# Patient Record
Sex: Male | Born: 1975
Health system: Southern US, Community
[De-identification: ages and names within clinical notes are randomized; demographics above are authoritative.]

## PROBLEM LIST (undated history)

## (undated) DIAGNOSIS — Z789 Other specified health status: Secondary | ICD-10-CM

## (undated) DIAGNOSIS — K5190 Ulcerative colitis, unspecified, without complications: Secondary | ICD-10-CM

## (undated) HISTORY — PX: NO PAST SURGERIES: SHX2092

## (undated) HISTORY — PX: COLONOSCOPY: SHX174

---

## 2000-11-16 ENCOUNTER — Encounter: Payer: Self-pay | Admitting: Emergency Medicine

## 2000-11-16 ENCOUNTER — Emergency Department (HOSPITAL_COMMUNITY): Admission: EM | Admit: 2000-11-16 | Discharge: 2000-11-16 | Payer: Self-pay | Admitting: Emergency Medicine

## 2002-01-19 ENCOUNTER — Ambulatory Visit (HOSPITAL_COMMUNITY): Admission: RE | Admit: 2002-01-19 | Discharge: 2002-01-19 | Payer: Self-pay | Admitting: Internal Medicine

## 2006-07-21 ENCOUNTER — Ambulatory Visit: Payer: Self-pay | Admitting: Internal Medicine

## 2006-10-13 ENCOUNTER — Ambulatory Visit: Payer: Self-pay | Admitting: Internal Medicine

## 2012-08-15 ENCOUNTER — Encounter (HOSPITAL_COMMUNITY): Payer: Self-pay | Admitting: *Deleted

## 2012-08-15 ENCOUNTER — Emergency Department (HOSPITAL_COMMUNITY): Payer: No Typology Code available for payment source

## 2012-08-15 ENCOUNTER — Emergency Department (HOSPITAL_COMMUNITY)
Admission: EM | Admit: 2012-08-15 | Discharge: 2012-08-15 | Disposition: A | Discharge status: Home / Self Care | Payer: No Typology Code available for payment source | Attending: Emergency Medicine | Admitting: Emergency Medicine

## 2012-08-15 DIAGNOSIS — Y92410 Unspecified street and highway as the place of occurrence of the external cause: Secondary | ICD-10-CM | POA: Insufficient documentation

## 2012-08-15 DIAGNOSIS — S0993XA Unspecified injury of face, initial encounter: Secondary | ICD-10-CM | POA: Insufficient documentation

## 2012-08-15 DIAGNOSIS — S199XXA Unspecified injury of neck, initial encounter: Secondary | ICD-10-CM | POA: Insufficient documentation

## 2012-08-15 DIAGNOSIS — Y9389 Activity, other specified: Secondary | ICD-10-CM | POA: Insufficient documentation

## 2012-08-15 DIAGNOSIS — M62838 Other muscle spasm: Secondary | ICD-10-CM | POA: Insufficient documentation

## 2012-08-15 DIAGNOSIS — M542 Cervicalgia: Secondary | ICD-10-CM

## 2012-08-15 MED ORDER — HYDROCODONE-ACETAMINOPHEN 5-325 MG PO TABS
2.0000 | ORAL_TABLET | Freq: Once | ORAL | Status: AC
  Administered 2012-08-15: 2 via ORAL
  Filled 2012-08-15: qty 2

## 2012-08-15 MED ORDER — CYCLOBENZAPRINE HCL 10 MG PO TABS: 10.0000 mg | ORAL_TABLET | Freq: Once | ORAL | Status: DC

## 2012-08-15 MED ORDER — CYCLOBENZAPRINE HCL 10 MG PO TABS
10.0000 mg | ORAL_TABLET | Freq: Once | ORAL | Status: AC
  Administered 2012-08-15: 10 mg via ORAL
  Filled 2012-08-15: qty 1

## 2012-08-15 MED ORDER — HYDROCODONE-ACETAMINOPHEN 5-325 MG PO TABS: 2.0000 | ORAL_TABLET | Freq: Once | ORAL | Status: DC

## 2012-08-15 NOTE — ED Notes (Signed)
Pt with c/o pain to left side of neck that radiates to left shoulder
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
## 2012-08-15 NOTE — ED Provider Notes (Signed)
residual .ressiden I reviewed the resident's note and I agree with the findings and plan.   Benny Lennert, MD 08/15/12 [phone]
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
## 2014-11-20 ENCOUNTER — Encounter (HOSPITAL_COMMUNITY): Payer: Self-pay | Admitting: Emergency Medicine

## 2014-11-20 ENCOUNTER — Emergency Department (HOSPITAL_COMMUNITY): Payer: BLUE CROSS/BLUE SHIELD

## 2014-11-20 ENCOUNTER — Emergency Department (HOSPITAL_COMMUNITY)
Admission: EM | Admit: 2014-11-20 | Discharge: 2014-11-20 | Disposition: A | Discharge status: Home / Self Care | Payer: BLUE CROSS/BLUE SHIELD | Attending: Emergency Medicine | Admitting: Emergency Medicine

## 2014-11-20 DIAGNOSIS — Z720 Tobacco use: Secondary | ICD-10-CM | POA: Diagnosis not present

## 2014-11-20 DIAGNOSIS — Y990 Civilian activity done for income or pay: Secondary | ICD-10-CM | POA: Diagnosis not present

## 2014-11-20 DIAGNOSIS — Y9389 Activity, other specified: Secondary | ICD-10-CM | POA: Diagnosis not present

## 2014-11-20 DIAGNOSIS — S39012A Strain of muscle, fascia and tendon of lower back, initial encounter: Secondary | ICD-10-CM | POA: Diagnosis not present

## 2014-11-20 DIAGNOSIS — Y92410 Unspecified street and highway as the place of occurrence of the external cause: Secondary | ICD-10-CM | POA: Diagnosis not present

## 2014-11-20 DIAGNOSIS — S3992XA Unspecified injury of lower back, initial encounter: Secondary | ICD-10-CM | POA: Diagnosis present

## 2014-11-20 MED ORDER — CYCLOBENZAPRINE HCL 10 MG PO TABS: 10.0000 mg | ORAL_TABLET | Freq: Three times a day (TID) | ORAL | Status: DC | PRN

## 2014-11-20 MED ORDER — IBUPROFEN 800 MG PO TABS: 800.0000 mg | ORAL_TABLET | Freq: Three times a day (TID) | ORAL | Status: DC

## 2014-11-20 MED ORDER — HYDROCODONE-ACETAMINOPHEN 5-325 MG PO TABS: ORAL_TABLET | ORAL | Status: DC

## 2014-11-20 NOTE — Discharge Instructions (Signed)

## 2014-11-20 NOTE — ED Notes (Signed)
Pt verbalized understanding of no driving and to use caution within 4 hours of taking pain meds due to meds cause drowsiness 
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
## 2014-11-20 NOTE — ED Notes (Signed)
T. Triplett, PA at bedside. 
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
## 2015-01-21 ENCOUNTER — Encounter (HOSPITAL_COMMUNITY): Payer: Self-pay

## 2015-01-21 ENCOUNTER — Emergency Department (HOSPITAL_COMMUNITY)
Admission: EM | Admit: 2015-01-21 | Discharge: 2015-01-21 | Disposition: A | Discharge status: Home / Self Care | Payer: BLUE CROSS/BLUE SHIELD | Attending: Emergency Medicine | Admitting: Emergency Medicine

## 2015-01-21 DIAGNOSIS — Z720 Tobacco use: Secondary | ICD-10-CM | POA: Insufficient documentation

## 2015-01-21 DIAGNOSIS — Z791 Long term (current) use of non-steroidal anti-inflammatories (NSAID): Secondary | ICD-10-CM | POA: Insufficient documentation

## 2015-01-21 DIAGNOSIS — Y998 Other external cause status: Secondary | ICD-10-CM | POA: Insufficient documentation

## 2015-01-21 DIAGNOSIS — S39012A Strain of muscle, fascia and tendon of lower back, initial encounter: Secondary | ICD-10-CM | POA: Diagnosis not present

## 2015-01-21 DIAGNOSIS — Y9289 Other specified places as the place of occurrence of the external cause: Secondary | ICD-10-CM | POA: Diagnosis not present

## 2015-01-21 DIAGNOSIS — X58XXXA Exposure to other specified factors, initial encounter: Secondary | ICD-10-CM | POA: Insufficient documentation

## 2015-01-21 DIAGNOSIS — Y9389 Activity, other specified: Secondary | ICD-10-CM | POA: Diagnosis not present

## 2015-01-21 DIAGNOSIS — S3992XA Unspecified injury of lower back, initial encounter: Secondary | ICD-10-CM | POA: Diagnosis present
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
## 2015-01-21 NOTE — ED Notes (Signed)
Lower back pain radiating upwards with movement/exertion since yesterday after moving furniture

## 2015-01-21 NOTE — Discharge Instructions (Signed)
Use ice on the sore area for 2-3 days (4 times a day), after that, use heat. Thak Ibuprofen or your Tramadol for pain.    Back Pain, Adult Low back pain is very common. About 1 in 5 people have back pain.The cause of low back pain is rarely dangerous. The pain often gets better over time.About half of people with a sudden onset of back pain feel better in just 2 weeks. About 8 in 10 people feel better by 6 weeks.  CAUSES Some common causes of back pain include:  Strain of the muscles or ligaments supporting the spine.  Wear and tear (degeneration) of the spinal discs.  Arthritis.  Direct injury to the back. DIAGNOSIS Most of the time, the direct cause of low back pain is not known.However, back pain can be treated effectively even when the exact cause of the pain is unknown.Answering your caregiver's questions about your overall health and symptoms is one of the most accurate ways to make sure the cause of your pain is not dangerous. If your caregiver needs more information, he or she may order lab work or imaging tests (X-rays or MRIs).However, even if imaging tests show changes in your back, this usually does not require surgery. HOME CARE INSTRUCTIONS For many people, back pain returns.Since low back pain is rarely dangerous, it is often a condition that people can learn to [facility] their own.   Remain active. It is stressful on the back to sit or stand in one place. Do not sit, drive, or stand in one place for more than 30 minutes at a time. Take short walks on level surfaces as soon as pain allows.Try to increase the length of time you walk each day.  Do not stay in bed.Resting more than 1 or 2 days can delay your recovery.  Do not avoid exercise or work.Your body is made to move.It is not dangerous to be active, even though your back may hurt.Your back will likely heal faster if you return to being active before your pain is gone.  Pay attention to your body when you  bend and lift. Many people have less discomfortwhen lifting if they bend their knees, keep the load close to their bodies,and avoid twisting. Often, the most comfortable positions are those that put less stress on your recovering back.  Find a comfortable position to sleep. Use a firm mattress and lie on your side with your knees slightly bent. If you lie on your back, put a pillow under your knees.  Only take over-the-counter or prescription medicines as directed by your caregiver. Over-the-counter medicines to reduce pain and inflammation are often the most helpful.Your caregiver may prescribe muscle relaxant drugs.These medicines help dull your pain so you can more quickly return to your normal activities and healthy exercise.  Put ice on the injured area.  Put ice in a plastic bag.  Place a towel between your skin and the bag.  Leave the ice on for 15-20 minutes, 03-04 times a day for the first 2 to 3 days. After that, ice and heat may be alternated to reduce pain and spasms.  Ask your caregiver about trying back exercises and gentle massage. This may be of some benefit.  Avoid feeling anxious or stressed.Stress increases muscle tension and can worsen back pain.It is important to recognize when you are anxious or stressed and learn ways to manage it.Exercise is a great option. SEEK MEDICAL CARE IF:  You have pain that is not relieved with  rest or medicine.  You have pain that does not improve in 1 week.  You have new symptoms.  You are generally not feeling well. SEEK IMMEDIATE MEDICAL CARE IF:   You have pain that radiates from your back into your legs.  You develop new bowel or bladder control problems.  You have unusual weakness or numbness in your arms or legs.  You develop nausea or vomiting.  You develop abdominal pain.  You feel faint. Document Released: 06/08/2005 Document Revised: 12/08/2011 Document Reviewed: 10/10/2013 [facility] Patient Information 2015  [place], [place]. This information is not intended to replace advice given to you by your health care provider. Make sure you discuss any questions you have with your health care provider.  Back Exercises Back exercises help treat and prevent back injuries. The goal of back exercises is to increase the strength of your abdominal and back muscles and the flexibility of your back. These exercises should be started when you no longer have back pain. Back exercises include:  Pelvic Tilt. Lie on your back with your knees bent. Tilt your pelvis until the lower part of your back is against the floor. Hold this position 5 to 10 sec and repeat 5 to 10 times.  Knee to Chest. Pull first 1 knee up against your chest and hold for 20 to 30 seconds, repeat this with the other knee, and then both knees. This may be done with the other leg straight or bent, whichever feels better.  Sit-Ups or Curl-Ups. Bend your knees 90 degrees. Start with tilting your pelvis, and do a partial, slow sit-up, lifting your trunk only 30 to 45 degrees off the floor. Take at least 2 to 3 seconds for each sit-up. Do not do sit-ups with your knees out straight. If partial sit-ups are difficult, simply do the above but with only tightening your abdominal muscles and holding it as directed.  Hip-Lift. Lie on your back with your knees flexed 90 degrees. Push down with your feet and shoulders as you raise your hips a couple inches off the floor; hold for 10 seconds, repeat 5 to 10 times.  Back arches. Lie on your stomach, propping yourself up on bent elbows. Slowly press on your hands, causing an arch in your low back. Repeat 3 to 5 times. Any initial stiffness and discomfort should lessen with repetition over time.  Shoulder-Lifts. Lie face down with arms beside your body. Keep hips and torso pressed to floor as you slowly lift your head and shoulders off the floor. Do not overdo your exercises, especially in the beginning. Exercises may cause  you some mild back discomfort which lasts for a few minutes; however, if the pain is more severe, or lasts for more than 15 minutes, do not continue exercises until you see your caregiver. Improvement with exercise therapy for back problems is slow.  See your caregivers for assistance with developing a proper back exercise program. Document Released: 07/16/2004 Document Revised: 08/31/2011 Document Reviewed: 04/09/2011 [facility] Patient Information 2015 [place], [place]. This information is not intended to replace advice given to you by your health care provider. Make sure you discuss any questions you have with your health care provider.

## 2016-02-03 DIAGNOSIS — K5190 Ulcerative colitis, unspecified, without complications: Secondary | ICD-10-CM | POA: Diagnosis not present

## 2016-07-31 DIAGNOSIS — Z0000 Encounter for general adult medical examination without abnormal findings: Secondary | ICD-10-CM | POA: Diagnosis not present

## 2016-08-04 DIAGNOSIS — Z6833 Body mass index (BMI) 33.0-33.9, adult: Secondary | ICD-10-CM | POA: Diagnosis not present

## 2016-08-04 DIAGNOSIS — E782 Mixed hyperlipidemia: Secondary | ICD-10-CM | POA: Diagnosis not present

## 2016-08-04 DIAGNOSIS — D509 Iron deficiency anemia, unspecified: Secondary | ICD-10-CM | POA: Diagnosis not present

## 2016-08-04 DIAGNOSIS — K5190 Ulcerative colitis, unspecified, without complications: Secondary | ICD-10-CM | POA: Diagnosis not present

## 2017-02-19 IMAGING — DX DG LUMBAR SPINE COMPLETE 4+V
5 series · 5 of 5 positions shown · non-contrast
Comparison: None.

CLINICAL DATA: Lumbago with left-sided radicular symptoms after
jerking motion at work 1 day prior

EXAM:
LUMBAR SPINE - COMPLETE 4+ VIEW

[l-spine ap]
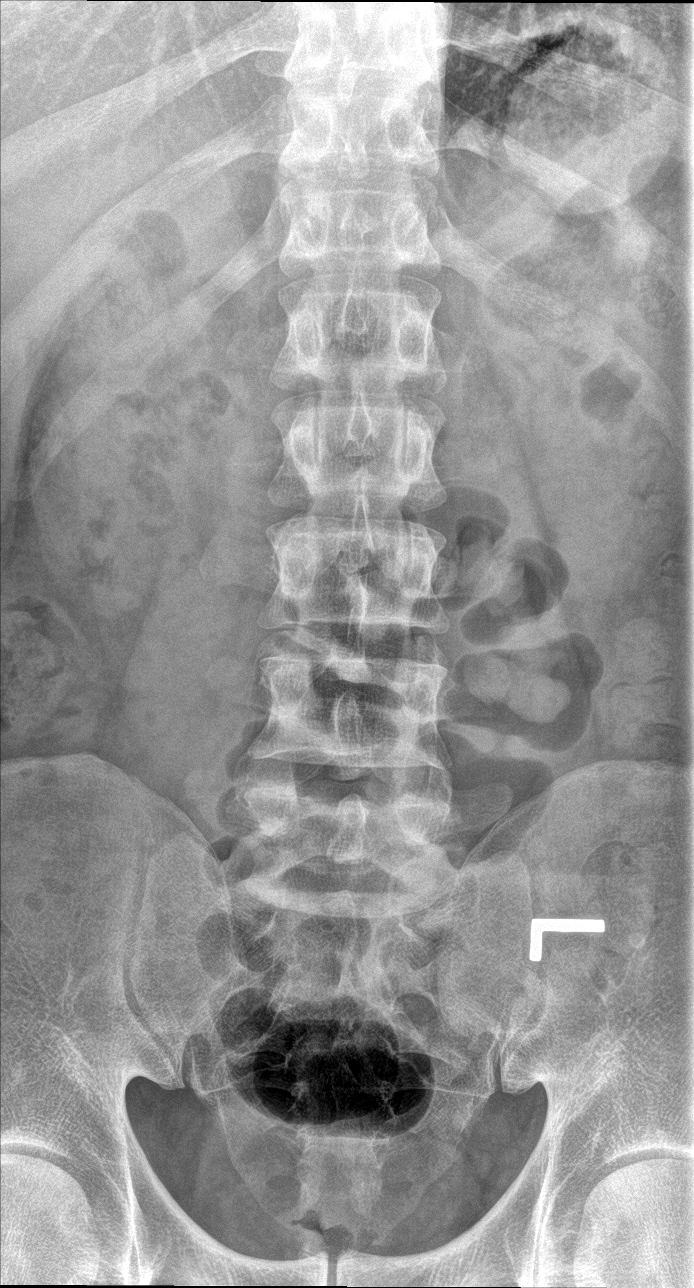

[l-spine obl (1 of 2)]
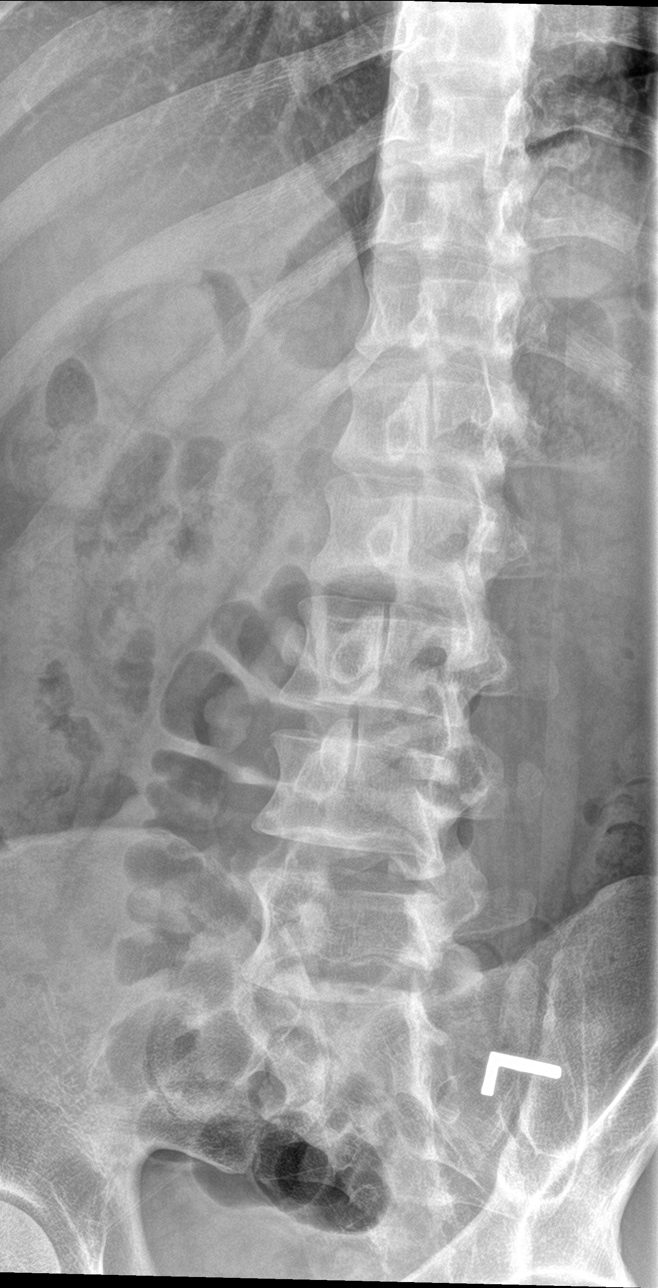

[l-spine obl (2 of 2)]
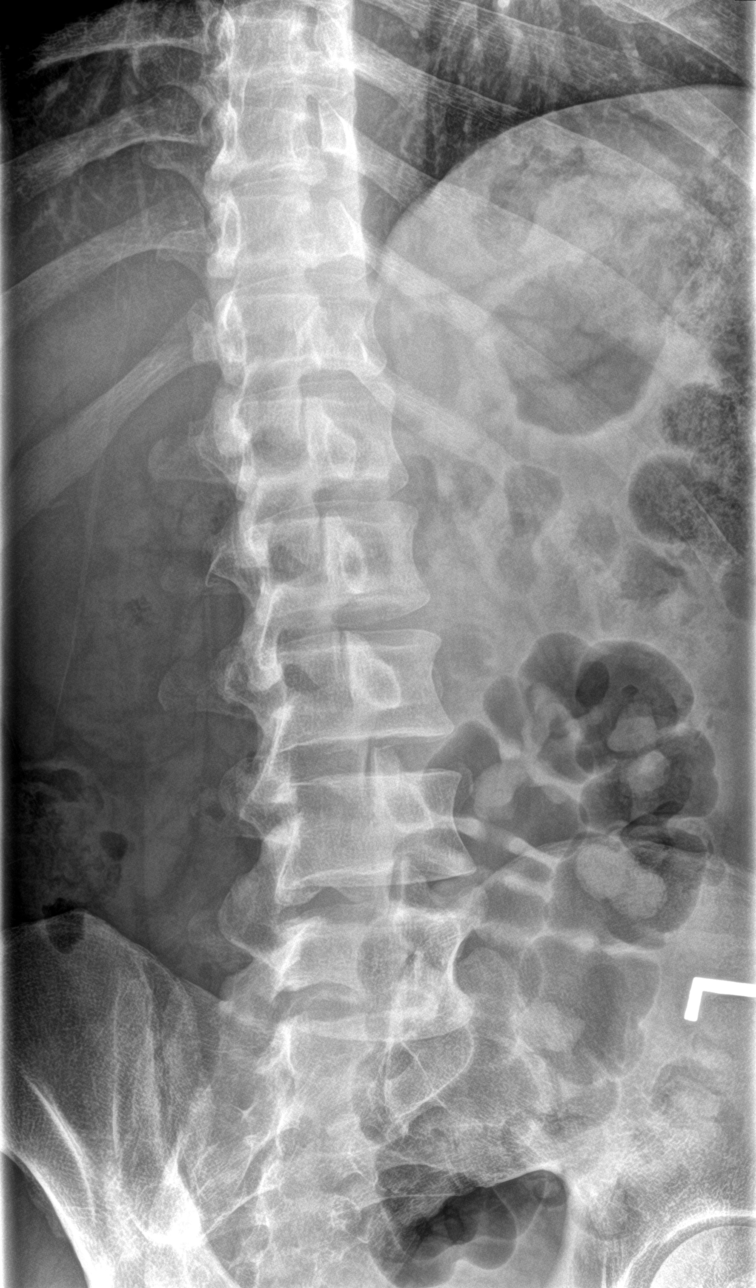

[l-spine lat]
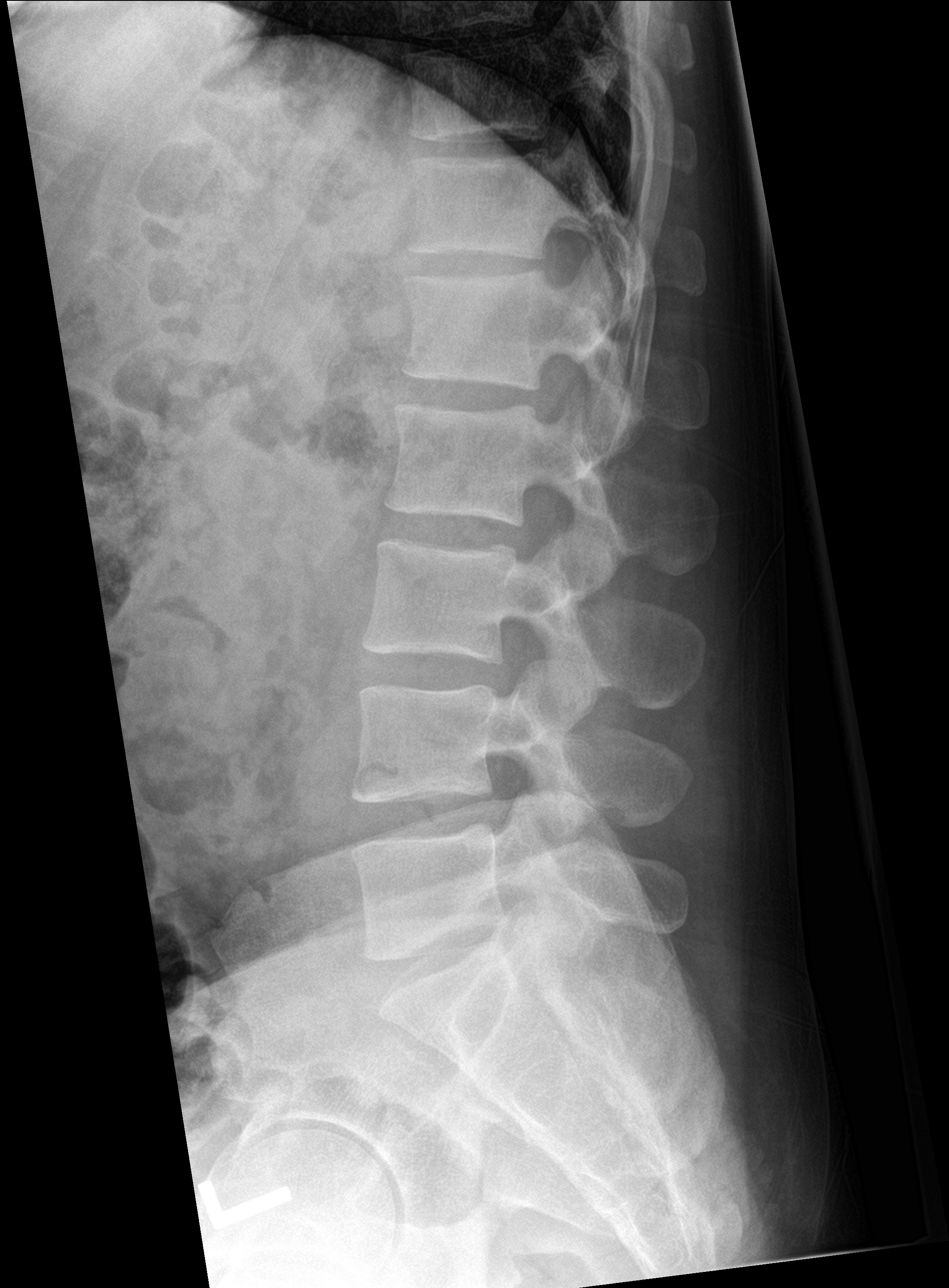

[l-spine spot]
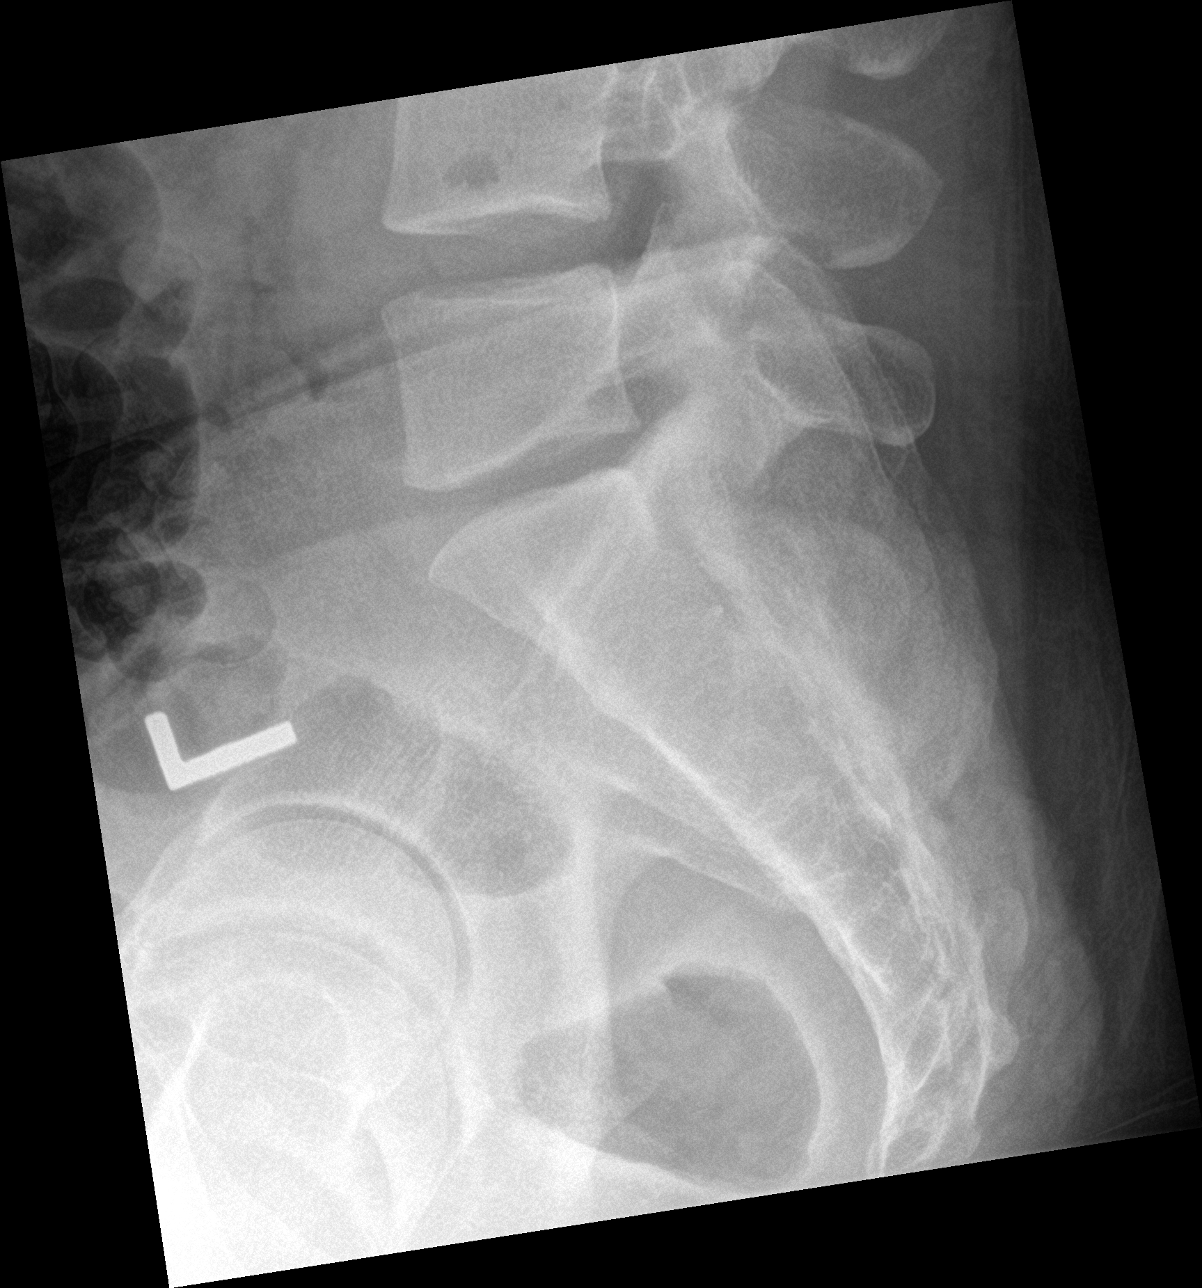

[5 of 5 positions shown; findings below may reference images not displayed]

FINDINGS: Frontal, lateral, spot lumbosacral lateral, and bilateral oblique
views were obtained. There are 5 non-rib-bearing lumbar type
vertebral bodies. There is no fracture or spondylolisthesis. Disc
spaces appear intact. There is no appreciable facet arthropathy.
IMPRESSION: No fracture or spondylolisthesis.  No appreciable arthropathy.

## 2017-07-23 DIAGNOSIS — K529 Noninfective gastroenteritis and colitis, unspecified: Secondary | ICD-10-CM | POA: Diagnosis not present

## 2017-07-23 DIAGNOSIS — M25521 Pain in right elbow: Secondary | ICD-10-CM | POA: Diagnosis not present

## 2017-08-02 DIAGNOSIS — D509 Iron deficiency anemia, unspecified: Secondary | ICD-10-CM | POA: Diagnosis not present

## 2017-08-02 DIAGNOSIS — E782 Mixed hyperlipidemia: Secondary | ICD-10-CM | POA: Diagnosis not present

## 2017-11-29 DIAGNOSIS — M79601 Pain in right arm: Secondary | ICD-10-CM | POA: Diagnosis not present

## 2017-11-29 DIAGNOSIS — M25521 Pain in right elbow: Secondary | ICD-10-CM | POA: Diagnosis not present

## 2018-02-03 DIAGNOSIS — M79601 Pain in right arm: Secondary | ICD-10-CM | POA: Diagnosis not present

## 2018-02-03 DIAGNOSIS — M25521 Pain in right elbow: Secondary | ICD-10-CM | POA: Diagnosis not present

## 2018-02-03 DIAGNOSIS — K529 Noninfective gastroenteritis and colitis, unspecified: Secondary | ICD-10-CM | POA: Diagnosis not present

## 2018-02-07 DIAGNOSIS — K529 Noninfective gastroenteritis and colitis, unspecified: Secondary | ICD-10-CM | POA: Diagnosis not present

## 2018-02-07 DIAGNOSIS — Z0000 Encounter for general adult medical examination without abnormal findings: Secondary | ICD-10-CM | POA: Diagnosis not present

## 2018-02-07 DIAGNOSIS — M25531 Pain in right wrist: Secondary | ICD-10-CM | POA: Diagnosis not present

## 2018-02-07 DIAGNOSIS — M79601 Pain in right arm: Secondary | ICD-10-CM | POA: Diagnosis not present

## 2018-04-01 DIAGNOSIS — M79601 Pain in right arm: Secondary | ICD-10-CM | POA: Diagnosis not present

## 2018-04-01 DIAGNOSIS — M25531 Pain in right wrist: Secondary | ICD-10-CM | POA: Diagnosis not present

## 2018-07-05 DIAGNOSIS — M25531 Pain in right wrist: Secondary | ICD-10-CM | POA: Diagnosis not present

## 2018-07-05 DIAGNOSIS — M79601 Pain in right arm: Secondary | ICD-10-CM | POA: Diagnosis not present

## 2018-07-07 DIAGNOSIS — M25521 Pain in right elbow: Secondary | ICD-10-CM | POA: Diagnosis not present

## 2018-11-09 DIAGNOSIS — Z6828 Body mass index (BMI) 28.0-28.9, adult: Secondary | ICD-10-CM | POA: Diagnosis not present

## 2018-11-09 DIAGNOSIS — A09 Infectious gastroenteritis and colitis, unspecified: Secondary | ICD-10-CM | POA: Diagnosis not present

## 2018-11-23 DIAGNOSIS — Z0001 Encounter for general adult medical examination with abnormal findings: Secondary | ICD-10-CM | POA: Diagnosis not present

## 2018-11-23 DIAGNOSIS — K529 Noninfective gastroenteritis and colitis, unspecified: Secondary | ICD-10-CM | POA: Diagnosis not present

## 2018-11-23 DIAGNOSIS — G894 Chronic pain syndrome: Secondary | ICD-10-CM | POA: Diagnosis not present

## 2018-11-23 DIAGNOSIS — A09 Infectious gastroenteritis and colitis, unspecified: Secondary | ICD-10-CM | POA: Diagnosis not present

## 2018-11-23 DIAGNOSIS — E782 Mixed hyperlipidemia: Secondary | ICD-10-CM | POA: Diagnosis not present

## 2018-11-23 DIAGNOSIS — R7301 Impaired fasting glucose: Secondary | ICD-10-CM | POA: Diagnosis not present

## 2018-11-23 DIAGNOSIS — R944 Abnormal results of kidney function studies: Secondary | ICD-10-CM | POA: Diagnosis not present

## 2018-11-23 DIAGNOSIS — M25521 Pain in right elbow: Secondary | ICD-10-CM | POA: Diagnosis not present

## 2019-01-23 DIAGNOSIS — R809 Proteinuria, unspecified: Secondary | ICD-10-CM | POA: Diagnosis not present

## 2019-01-23 DIAGNOSIS — R1030 Lower abdominal pain, unspecified: Secondary | ICD-10-CM | POA: Diagnosis not present

## 2019-03-07 DIAGNOSIS — A09 Infectious gastroenteritis and colitis, unspecified: Secondary | ICD-10-CM | POA: Diagnosis not present

## 2019-03-07 DIAGNOSIS — R5381 Other malaise: Secondary | ICD-10-CM | POA: Diagnosis not present

## 2019-03-07 DIAGNOSIS — F1120 Opioid dependence, uncomplicated: Secondary | ICD-10-CM | POA: Diagnosis not present

## 2019-03-07 DIAGNOSIS — Z0001 Encounter for general adult medical examination with abnormal findings: Secondary | ICD-10-CM | POA: Diagnosis not present

## 2019-03-07 DIAGNOSIS — K529 Noninfective gastroenteritis and colitis, unspecified: Secondary | ICD-10-CM | POA: Diagnosis not present

## 2019-03-07 DIAGNOSIS — M25521 Pain in right elbow: Secondary | ICD-10-CM | POA: Diagnosis not present

## 2019-03-14 ENCOUNTER — Encounter: Payer: Self-pay | Admitting: Gastroenterology

## 2019-03-30 NOTE — Progress Notes (Deleted)
Referring Provider:  Benita Stabile, MD Primary Care Physician:  Benita Stabile, MD Primary Gastroenterologist:  Dr. Darrick Penna  No chief complaint on file.   HPI:   Joe Freeman is a 43 y.o. male presenting today at the request of  Margo Aye, Kathleene Hazel, MD for ulcerative colitis.   Doesn't appear patient has been seen by GI 2008.  Appears he saw Dr. Karilyn Cota back in 2003 in 2008 but I cannot review the notes.    No past medical history on file.  No past surgical history on file.  Current Outpatient Medications  Medication Sig Dispense Refill  . acetaminophen (TYLENOL) 500 MG tablet Take 1,000 mg by mouth every 6 (six) hours as needed.    . cyclobenzaprine (FLEXERIL) 10 MG tablet Take 1 tablet (10 mg total) by mouth 3 (three) times daily as needed. (Patient not taking: Reported on 01/21/2015) 21 tablet 0  . HYDROcodone-acetaminophen (NORCO/VICODIN) 5-325 MG per tablet Take one-two tabs po q 4-6 hrs prn pain (Patient not taking: Reported on 01/21/2015) 15 tablet 0  . ibuprofen (ADVIL,MOTRIN) 800 MG tablet Take 1 tablet (800 mg total) by mouth 3 (three) times daily. (Patient not taking: Reported on 01/21/2015) 21 tablet 0  . predniSONE (DELTASONE) 10 MG tablet Take 10 mg by mouth as directed.    . traMADol (ULTRAM) 50 MG tablet Take 50 mg by mouth 3 (three) times daily as needed. For pain     No current facility-administered medications for this visit.     Allergies as of 03/31/2019  . (No Known Allergies)    No family history on file.  Social History   Socioeconomic History  . Marital status: Single    Spouse name: Not on file  . Number of children: Not on file  . Years of education: Not on file  . Highest education level: Not on file  Occupational History  . Not on file  Social Needs  . Financial resource strain: Not on file  . Food insecurity    Worry: Not on file    Inability: Not on file  . Transportation needs    Medical: Not on file    Non-medical: Not on file  Tobacco Use   . Smoking status: Light Tobacco Smoker    Packs/day: 0.25    Types: Cigars  Substance and Sexual Activity  . Alcohol use: No  . Drug use: No  . Sexual activity: Not on file  Lifestyle  . Physical activity    Days per week: Not on file    Minutes per session: Not on file  . Stress: Not on file  Relationships  . Social Musician on phone: Not on file    Gets together: Not on file    Attends religious service: Not on file    Active member of club or organization: Not on file    Attends meetings of clubs or organizations: Not on file    Relationship status: Not on file  . Intimate partner violence    Fear of current or ex partner: Not on file    Emotionally abused: Not on file    Physically abused: Not on file    Forced sexual activity: Not on file  Other Topics Concern  . Not on file  Social History Narrative  . Not on file    Review of Systems: Gen: Denies any fever, chills, fatigue, weight loss, lack of appetite.  CV: Denies chest pain, heart palpitations, peripheral  edema, syncope.  Resp: Denies shortness of breath at rest or with exertion. Denies wheezing or cough.  GI: Denies dysphagia or odynophagia. Denies jaundice, hematemesis, fecal incontinence. GU : Denies urinary burning, urinary frequency, urinary hesitancy MS: Denies joint pain, muscle weakness, cramps, or limitation of movement.  Derm: Denies rash, itching, dry skin Psych: Denies depression, anxiety, memory loss, and confusion Heme: Denies bruising, bleeding, and enlarged lymph nodes.  Physical Exam: There were no vitals taken for this visit. General:   Alert and oriented. Pleasant and cooperative. Well-nourished and well-developed.  Head:  Normocephalic and atraumatic. Eyes:  Without icterus, sclera clear and conjunctiva pink.  Ears:  Normal auditory acuity. Nose:  No deformity, discharge,  or lesions. Mouth:  No deformity or lesions, oral mucosa pink.  Neck:  Supple, without mass or  thyromegaly. Lungs:  Clear to auscultation bilaterally. No wheezes, rales, or rhonchi. No distress.  Heart:  S1, S2 present without murmurs appreciated.  Abdomen:  +BS, soft, non-tender and non-distended. No HSM noted. No guarding or rebound. No masses appreciated.  Rectal:  Deferred  Msk:  Symmetrical without gross deformities. Normal posture. Pulses:  Normal pulses noted. Extremities:  Without clubbing or edema. Neurologic:  Alert and  oriented x4;  grossly normal neurologically. Skin:  Intact without significant lesions or rashes. Cervical Nodes:  No significant cervical adenopathy. Psych:  Alert and cooperative. Normal mood and affect.

## 2019-03-31 ENCOUNTER — Encounter: Payer: Self-pay | Admitting: Gastroenterology

## 2019-03-31 ENCOUNTER — Ambulatory Visit: Payer: BLUE CROSS/BLUE SHIELD | Admitting: Gastroenterology

## 2019-03-31 ENCOUNTER — Telehealth: Payer: Self-pay | Admitting: Gastroenterology

## 2019-03-31 NOTE — Telephone Encounter (Signed)
PATIENT WAS A NO SHOW AND LETTER SENT  °

## 2020-02-19 DIAGNOSIS — M722 Plantar fascial fibromatosis: Secondary | ICD-10-CM | POA: Diagnosis not present

## 2020-03-21 DIAGNOSIS — M25521 Pain in right elbow: Secondary | ICD-10-CM | POA: Diagnosis not present

## 2020-03-21 DIAGNOSIS — Z0001 Encounter for general adult medical examination with abnormal findings: Secondary | ICD-10-CM | POA: Diagnosis not present

## 2020-03-21 DIAGNOSIS — A09 Infectious gastroenteritis and colitis, unspecified: Secondary | ICD-10-CM | POA: Diagnosis not present

## 2020-03-21 DIAGNOSIS — K529 Noninfective gastroenteritis and colitis, unspecified: Secondary | ICD-10-CM | POA: Diagnosis not present

## 2020-03-21 DIAGNOSIS — U071 COVID-19: Secondary | ICD-10-CM | POA: Diagnosis not present

## 2020-03-27 DIAGNOSIS — U071 COVID-19: Secondary | ICD-10-CM | POA: Diagnosis not present

## 2020-03-29 ENCOUNTER — Ambulatory Visit (HOSPITAL_COMMUNITY)
Admission: RE | Admit: 2020-03-29 | Discharge: 2020-03-29 | Disposition: A | Discharge status: Home / Self Care | Payer: BC Managed Care – PPO | Source: Ambulatory Visit | Attending: Pulmonary Disease | Admitting: Pulmonary Disease

## 2020-03-29 ENCOUNTER — Telehealth: Payer: Self-pay | Admitting: Family

## 2020-03-29 ENCOUNTER — Other Ambulatory Visit: Payer: Self-pay | Admitting: Family

## 2020-03-29 DIAGNOSIS — U071 COVID-19: Secondary | ICD-10-CM

## 2020-03-29 DIAGNOSIS — Z720 Tobacco use: Secondary | ICD-10-CM | POA: Insufficient documentation

## 2020-03-29 DIAGNOSIS — E663 Overweight: Secondary | ICD-10-CM | POA: Insufficient documentation

## 2020-03-29 MED ORDER — FAMOTIDINE IN NACL 20-0.9 MG/50ML-% IV SOLN: 20.0000 mg | Freq: Once | INTRAVENOUS | Status: DC | PRN

## 2020-03-29 MED ORDER — SODIUM CHLORIDE 0.9 % IV SOLN: INTRAVENOUS | Status: DC | PRN

## 2020-03-29 MED ORDER — DIPHENHYDRAMINE HCL 50 MG/ML IJ SOLN: 50.0000 mg | Freq: Once | INTRAMUSCULAR | Status: DC | PRN

## 2020-03-29 MED ORDER — EPINEPHRINE 0.3 MG/0.3ML IJ SOAJ: 0.3000 mg | Freq: Once | INTRAMUSCULAR | Status: DC | PRN

## 2020-03-29 MED ORDER — SODIUM CHLORIDE 0.9 % IV SOLN: Freq: Once | INTRAVENOUS | Status: DC

## 2020-03-29 MED ORDER — METHYLPREDNISOLONE SODIUM SUCC 125 MG IJ SOLR: 125.0000 mg | Freq: Once | INTRAMUSCULAR | Status: DC | PRN

## 2020-03-29 MED ORDER — SODIUM CHLORIDE 0.9 % IV SOLN: Freq: Once | INTRAVENOUS | Status: AC

## 2020-03-29 MED ORDER — ALBUTEROL SULFATE HFA 108 (90 BASE) MCG/ACT IN AERS: 2.0000 | INHALATION_SPRAY | Freq: Once | RESPIRATORY_TRACT | Status: DC | PRN

## 2020-03-29 NOTE — Telephone Encounter (Signed)
Called to Discuss with patient about Covid symptoms and the use of the monoclonal antibody infusion for those with mild to moderate Covid symptoms and at a high risk of hospitalization.     Pt appears to qualify for this infusion due to co-morbid conditions and/or a member of an at-risk group in accordance with the FDA Emergency Use Authorization.   Joe Freeman had a positive COVID test on 10/4 through his PCP's office. Symptom onset was 9/30. Current symptoms include fever and cough. Qualifying risk factors include BMI >25 and tobacco use.   Discussed the risks and benefits of treatment with Regeneron and he wishes to proceed with treatment.    Hello Joe Freeman,   You have been scheduled to receive Regeneron (the monoclonal antibody we discussed) on : 03/29/20 at 5pm  If you have been tested outside of a [facility] - you MUST bring a copy of your positive test with you the morning of your appointment. You may take a photo of this and upload to your MyChart portal or have the testing facility fax the result to [phone]    The address for the infusion clinic site is:  --GPS address is [address] - the parking is located near [employer] building where you will see  COVID19 Infusion feather banner marking the entrance to parking.   (see photos below)            --Enter into the 2nd entrance where the "wave, flag banner" is at the road. Turn into this 2nd entrance and immediately turn left to park in 1 of the 5 parking spots.   --Please stay in your car and call the desk for assistance inside [phone].   --Average time in department is roughly 2 hours for Regeneron treatment - this includes preparation of the medication, IV start and the required 1 hour monitoring after the infusion.    Should you develop worsening shortness of breath, chest pain or severe breathing problems please do not wait for this appointment and go to the Emergency room for evaluation  and treatment. You will undergo another oxygen screen before your infusion to ensure this is the best treatment option for you. There is a chance that the best decision may be to send you to the Emergency Room for evaluation at the time of your appointment.   The day of your visit you should: Marland Kitchen Get plenty of rest the night before and drink plenty of water . Eat a light meal/snack before coming and take your medications as prescribed  . Wear warm, comfortable clothes with a shirt that can roll-up over the elbow (will need IV start).  . Wear a mask  . Consider bringing some activity to help pass the time  Many commercial insurers are waiving bills related to COVID treatment however some have ranged from $300-640. We are starting to see some insurers send bills to patients later for the administration of the medication - we are learning more information but you may receive a bill after your appointment.  Please contact your insurance agent to discuss prior to your appointment if you would like further details about billing specific to your policy.    The CPT code is [phone] for your reference.   Joe Freeman, Joe Freeman 03/29/2020 4:01 PM

## 2020-03-29 NOTE — Progress Notes (Signed)
I connected by phone with Saunders Glance on 03/29/2020 at 4:02 PM to discuss the potential use of a new treatment for mild to moderate COVID-19 viral infection in non-hospitalized patients.  This patient is a 44 y.o. male that meets the FDA criteria for Emergency Use Authorization of COVID monoclonal antibody casirivimab/imdevimab or bamlanivimab/eteseviamb.  Has a (+) direct SARS-CoV-2 viral test result  Has mild or moderate COVID-19   Is NOT hospitalized due to COVID-19  Is within 10 days of symptom onset  Has at least one of the high risk factor(s) for progression to severe COVID-19 and/or hospitalization as defined in EUA.  Specific high risk criteria : BMI > 25 and Other high risk medical condition per CDC:  tobacco use   I have spoken and communicated the following to the patient or parent/caregiver regarding COVID monoclonal antibody treatment:  1. FDA has authorized the emergency use for the treatment of mild to moderate COVID-19 in adults and pediatric patients with positive results of direct SARS-CoV-2 viral testing who are 37 years of age and older weighing at least 40 kg, and who are at high risk for progressing to severe COVID-19 and/or hospitalization.  2. The significant known and potential risks and benefits of COVID monoclonal antibody, and the extent to which such potential risks and benefits are unknown.  3. Information on available alternative treatments and the risks and benefits of those alternatives, including clinical trials.  4. Patients treated with COVID monoclonal antibody should continue to self-isolate and use infection control measures (e.g., wear mask, isolate, social distance, avoid sharing personal items, clean and disinfect "high touch" surfaces, and frequent handwashing) according to CDC guidelines.   5. The patient or parent/caregiver has the option to accept or refuse COVID monoclonal antibody treatment.  After reviewing this information with the  patient, the patient has agreed to receive one of the available covid 19 monoclonal antibodies and will be provided an appropriate fact sheet prior to infusion.   Jeanine Luz, FNP 03/29/2020 4:02 PM

## 2020-03-29 NOTE — Progress Notes (Signed)
°  Diagnosis: COVID-19  Physician: Dr Delford Field   Procedure: Covid Infusion Clinic Med: bamlanivimab\etesevimab infusion - Provided patient with bamlanimivab\etesevimab fact sheet for patients, parents and caregivers prior to infusion.  Complications: No immediate complications noted.  Discharge: Discharged home   Joe Freeman 03/29/2020

## 2020-03-29 NOTE — Discharge Instructions (Signed)

## 2020-05-21 DIAGNOSIS — K529 Noninfective gastroenteritis and colitis, unspecified: Secondary | ICD-10-CM | POA: Diagnosis not present

## 2020-05-21 DIAGNOSIS — M25521 Pain in right elbow: Secondary | ICD-10-CM | POA: Diagnosis not present

## 2020-05-21 DIAGNOSIS — Z0001 Encounter for general adult medical examination with abnormal findings: Secondary | ICD-10-CM | POA: Diagnosis not present

## 2020-05-21 DIAGNOSIS — A09 Infectious gastroenteritis and colitis, unspecified: Secondary | ICD-10-CM | POA: Diagnosis not present

## 2020-05-21 DIAGNOSIS — R5381 Other malaise: Secondary | ICD-10-CM | POA: Diagnosis not present

## 2020-05-25 DIAGNOSIS — Z0001 Encounter for general adult medical examination with abnormal findings: Secondary | ICD-10-CM | POA: Diagnosis not present

## 2020-08-09 DIAGNOSIS — M722 Plantar fascial fibromatosis: Secondary | ICD-10-CM | POA: Diagnosis not present

## 2020-09-02 ENCOUNTER — Encounter: Payer: Self-pay | Admitting: Podiatry

## 2020-09-02 ENCOUNTER — Other Ambulatory Visit: Payer: Self-pay

## 2020-09-02 ENCOUNTER — Ambulatory Visit: Payer: BC Managed Care – PPO | Admitting: Podiatry

## 2020-09-02 ENCOUNTER — Ambulatory Visit (INDEPENDENT_AMBULATORY_CARE_PROVIDER_SITE_OTHER): Payer: BC Managed Care – PPO

## 2020-09-02 DIAGNOSIS — M722 Plantar fascial fibromatosis: Secondary | ICD-10-CM | POA: Diagnosis not present

## 2020-09-02 MED ORDER — TRIAMCINOLONE ACETONIDE 10 MG/ML IJ SUSP
10.0000 mg | Freq: Once | INTRAMUSCULAR | Status: AC
  Administered 2020-09-02: 10 mg

## 2020-09-02 MED ORDER — DICLOFENAC SODIUM 75 MG PO TBEC: 75.0000 mg | DELAYED_RELEASE_TABLET | Freq: Two times a day (BID) | ORAL | 2 refills | Status: DC

## 2020-09-02 NOTE — Patient Instructions (Signed)

## 2020-09-02 NOTE — Progress Notes (Signed)
Subjective:   Patient ID: Joe Freeman, male   DOB: 45 y.o.   MRN: [id number]   HPI Patient states he is developed a lot of pain in his right heel and he does work a weightbearing job concrete floors numerous hours.  States is been sore for around a year and patient does not smoke smokes occasional cigars and likes to be active   Review of Systems  All other systems reviewed and are negative.       Objective:  Physical Exam Vitals and nursing note reviewed.  Constitutional:      Appearance: He is well-developed.  Pulmonary:     Effort: Pulmonary effort is normal.  Musculoskeletal:        General: Normal range of motion.  Skin:    General: Skin is warm.  Neurological:     Mental Status: He is alert.     Neurovascular status intact muscle strength found to be adequate range of motion adequate.  Patient is found to have exquisite discomfort plantar aspect right heel at the insertional point tendon calcaneus with inflammation fluid around the medial band with good digital perfusion noted     Assessment:  Acute plantar fasciitis right inflammation fluid around the medial band     Plan:  H&P x-rays reviewed sterile prep done injected fascia 3 mg Kenalog 5 mg Xylocaine discussed physical therapy anti-inflammatory support therapy and patient will be seen back 2 weeks and discuss possible orthotics for future  X-rays indicate spur formation no indication stress fracture arthritis currently

## 2020-09-16 ENCOUNTER — Ambulatory Visit: Payer: BC Managed Care – PPO | Admitting: Podiatry

## 2021-05-22 DIAGNOSIS — E782 Mixed hyperlipidemia: Secondary | ICD-10-CM | POA: Diagnosis not present

## 2021-05-28 DIAGNOSIS — Z0001 Encounter for general adult medical examination with abnormal findings: Secondary | ICD-10-CM | POA: Diagnosis not present

## 2021-05-28 DIAGNOSIS — Z23 Encounter for immunization: Secondary | ICD-10-CM | POA: Diagnosis not present

## 2021-05-29 ENCOUNTER — Encounter (INDEPENDENT_AMBULATORY_CARE_PROVIDER_SITE_OTHER): Payer: Self-pay | Admitting: *Deleted

## 2021-06-26 DIAGNOSIS — K529 Noninfective gastroenteritis and colitis, unspecified: Secondary | ICD-10-CM | POA: Diagnosis not present

## 2021-09-04 DIAGNOSIS — R7301 Impaired fasting glucose: Secondary | ICD-10-CM | POA: Diagnosis not present

## 2021-09-04 DIAGNOSIS — E782 Mixed hyperlipidemia: Secondary | ICD-10-CM | POA: Diagnosis not present

## 2021-09-04 DIAGNOSIS — R5383 Other fatigue: Secondary | ICD-10-CM | POA: Diagnosis not present

## 2021-09-10 DIAGNOSIS — K529 Noninfective gastroenteritis and colitis, unspecified: Secondary | ICD-10-CM | POA: Diagnosis not present

## 2021-09-10 DIAGNOSIS — E782 Mixed hyperlipidemia: Secondary | ICD-10-CM | POA: Diagnosis not present

## 2021-09-10 DIAGNOSIS — R7301 Impaired fasting glucose: Secondary | ICD-10-CM | POA: Diagnosis not present

## 2021-09-10 DIAGNOSIS — D649 Anemia, unspecified: Secondary | ICD-10-CM | POA: Diagnosis not present

## 2021-09-18 ENCOUNTER — Encounter (INDEPENDENT_AMBULATORY_CARE_PROVIDER_SITE_OTHER): Payer: Self-pay | Admitting: Gastroenterology

## 2021-09-18 ENCOUNTER — Ambulatory Visit (INDEPENDENT_AMBULATORY_CARE_PROVIDER_SITE_OTHER): Payer: BC Managed Care – PPO | Admitting: Gastroenterology

## 2021-09-25 ENCOUNTER — Encounter (INDEPENDENT_AMBULATORY_CARE_PROVIDER_SITE_OTHER): Payer: Self-pay | Admitting: *Deleted

## 2022-04-13 DIAGNOSIS — R7301 Impaired fasting glucose: Secondary | ICD-10-CM | POA: Diagnosis not present

## 2022-04-13 DIAGNOSIS — E782 Mixed hyperlipidemia: Secondary | ICD-10-CM | POA: Diagnosis not present

## 2022-04-13 DIAGNOSIS — R5383 Other fatigue: Secondary | ICD-10-CM | POA: Diagnosis not present

## 2022-04-13 DIAGNOSIS — E291 Testicular hypofunction: Secondary | ICD-10-CM | POA: Diagnosis not present

## 2022-04-21 ENCOUNTER — Other Ambulatory Visit (HOSPITAL_COMMUNITY): Payer: Self-pay | Admitting: Internal Medicine

## 2022-04-21 DIAGNOSIS — E782 Mixed hyperlipidemia: Secondary | ICD-10-CM

## 2022-04-21 DIAGNOSIS — R7301 Impaired fasting glucose: Secondary | ICD-10-CM | POA: Diagnosis not present

## 2022-04-21 DIAGNOSIS — Z0000 Encounter for general adult medical examination without abnormal findings: Secondary | ICD-10-CM | POA: Diagnosis not present

## 2022-04-21 DIAGNOSIS — Z23 Encounter for immunization: Secondary | ICD-10-CM | POA: Diagnosis not present

## 2022-04-21 DIAGNOSIS — K529 Noninfective gastroenteritis and colitis, unspecified: Secondary | ICD-10-CM | POA: Diagnosis not present

## 2022-04-23 ENCOUNTER — Encounter (INDEPENDENT_AMBULATORY_CARE_PROVIDER_SITE_OTHER): Payer: Self-pay | Admitting: *Deleted

## 2022-06-02 ENCOUNTER — Ambulatory Visit (HOSPITAL_COMMUNITY): Payer: BC Managed Care – PPO

## 2022-06-02 ENCOUNTER — Encounter (HOSPITAL_COMMUNITY): Payer: Self-pay

## 2022-07-13 ENCOUNTER — Ambulatory Visit (INDEPENDENT_AMBULATORY_CARE_PROVIDER_SITE_OTHER): Payer: BC Managed Care – PPO | Admitting: Gastroenterology

## 2022-07-13 ENCOUNTER — Encounter (INDEPENDENT_AMBULATORY_CARE_PROVIDER_SITE_OTHER): Payer: Self-pay | Admitting: *Deleted

## 2022-10-05 ENCOUNTER — Ambulatory Visit (HOSPITAL_COMMUNITY)
Admission: RE | Admit: 2022-10-05 | Discharge: 2022-10-05 | Disposition: A | Discharge status: Home / Self Care | Payer: Self-pay | Source: Ambulatory Visit | Attending: Internal Medicine | Admitting: Internal Medicine

## 2022-10-05 DIAGNOSIS — E782 Mixed hyperlipidemia: Secondary | ICD-10-CM | POA: Insufficient documentation

## 2023-03-31 ENCOUNTER — Encounter (INDEPENDENT_AMBULATORY_CARE_PROVIDER_SITE_OTHER): Payer: Self-pay | Admitting: *Deleted

## 2023-05-03 ENCOUNTER — Telehealth (INDEPENDENT_AMBULATORY_CARE_PROVIDER_SITE_OTHER): Payer: Self-pay | Admitting: *Deleted

## 2023-05-03 NOTE — Telephone Encounter (Signed)
Who is your primary care physician: Dr. Margo Aye  Have you had a colonoscopy before?  yes  Do you have family history of colon cancer? no  Previous colonoscopy with polyps removed? no  Do you have a history colorectal cancer?   no  Are you diabetic? If yes, Type 1 or Type 2?    no  Do you have a prosthetic or mechanical heart valve? no  Do you have a pacemaker/defibrillator?   no  Have you had endocarditis/atrial fibrillation? no  Have you had joint replacement within the last 12 months?  no  Do you tend to be constipated or have to use laxatives? no  Do you have any history of drugs or alchohol?  no  Do you use supplemental oxygen?  no  Have you had a stroke or heart attack within the last 6 months? no  Do you take weight loss medication?  no   Do you take any blood-thinning medications such as: (aspirin, warfarin, Plavix, Aggrenox)  no  If yes we need the name, milligram, dosage and who is prescribing doctor   No medications listed   No Known Allergies   Pharmacy: [facility] [place]   Best number where you can be reached: [phone]

## 2023-05-04 ENCOUNTER — Encounter (INDEPENDENT_AMBULATORY_CARE_PROVIDER_SITE_OTHER): Payer: Self-pay | Admitting: *Deleted

## 2023-05-04 MED ORDER — PEG 3350-KCL-NA BICARB-NACL 420 G PO SOLR: 4000.0000 mL | Freq: Once | ORAL | 0 refills | Status: AC

## 2023-05-04 NOTE — Telephone Encounter (Signed)
Referral completed, [facility] apt letter sent to PCP

## 2023-05-04 NOTE — Addendum Note (Signed)
Addended by: Marlowe Shores on: 05/04/2023 12:19 PM   Modules accepted: Orders

## 2023-05-04 NOTE — Telephone Encounter (Signed)
Pt contacted and scheduled for 06/10/23 at 8:30 am with Dr.Ahmed. instructions will be mailed to pt once telephone pre op appt has been received. Per [employer]: Prior authorization search results Decision ID: [id number] Please save for your records In-network Out-of-network [facility] is part of this tier Participating network name: [employer]  Place of service: [facility] Procedure code: 47829 - Colonoscopy, flexible; diagnostic, including collection of specimen(s) by brushing or washing, when performed (separate procedure) Diagnosis: Z12.11 - Encounter for screening for malignant neoplasm of colon Date of service: 06/10/2023 TAX ID number (TIN): [id number] No requirements found for this procedure code. Please see important additional requirements that may apply below

## 2023-06-07 ENCOUNTER — Encounter (HOSPITAL_COMMUNITY)
Admission: RE | Admit: 2023-06-07 | Discharge: 2023-06-07 | Disposition: A | Discharge status: Home / Self Care | Payer: Commercial Managed Care - PPO | Source: Ambulatory Visit | Attending: Gastroenterology | Admitting: Gastroenterology

## 2023-06-07 ENCOUNTER — Encounter (HOSPITAL_COMMUNITY): Payer: Self-pay

## 2023-06-07 HISTORY — DX: Other specified health status: Z78.9

## 2023-06-07 NOTE — Patient Instructions (Signed)
Joe Freeman  06/07/2023     @PREFPERIOPPHARMACY @   Your procedure is scheduled on 06/09/2024.   Report to [facility] at 6:45 A.M.   Call this number if you have problems the morning of surgery:  [phone]  If you experience any cold or flu symptoms such as cough, fever, chills, shortness of breath, etc. between now and your scheduled surgery, please notify us at the above number.   Remember:   Please Follow the diet and prep instructions given to you by Dr Marcelino Freestone office.    You may drink clear liquids until 4:15am .  Clear liquids allowed are:                    Water, Carbonated beverages (diabetics please choose diet or no sugar options), Clear Tea (No creamer, milk, or cream, including half & half and powdered creamer), Black Coffee Only (No creamer, milk or cream, including half & half and powdered creamer), and Clear Sports drink (No red color; diabetics please choose diet or no sugar options)    Take these medicines the morning of surgery with A SIP OF WATER       Do not wear jewelry, make-up or nail polish, including gel polish,  artificial nails, or any other type of covering on natural nails (fingers and  toes).  Do not wear lotions, powders, or perfumes, or deodorant.  Do not shave 48 hours prior to surgery.  Men may shave face and neck.  Do not bring valuables to the hospital.  [facility] is not responsible for any belongings or valuables.  Contacts, dentures or bridgework may not be worn into surgery.  Leave your suitcase in the car.  After surgery it may be brought to your room.  For patients admitted to the hospital, discharge time will be determined by your treatment team.  Patients discharged the day of surgery will not be allowed to drive home.   Name and phone number of your driver:   Daishon Hedman Special instructions:  N/A  Please read over the following fact sheets that you were given. Care and Recovery After Surgery  Colonoscopy, Adult A  colonoscopy is a procedure to look at the entire large intestine. This procedure is done using a long, thin, flexible tube that has a camera on the end. You may have a colonoscopy: As a part of normal colorectal screening. If you have certain symptoms, such as: A low number of red blood cells in your blood (anemia). Diarrhea that does not go away. Pain in your abdomen. Blood in your stool. A colonoscopy can help screen for and diagnose medical problems, including: An abnormal growth of cells or tissue (tumor). Abnormal growths within the lining of your intestine (polyps). Inflammation. Areas of bleeding. Tell your health care provider about: Any allergies you have. All medicines you are taking, including vitamins, herbs, eye drops, creams, and over-the-counter medicines. Any problems you or family members have had with anesthetic medicines. Any bleeding problems you have. Any surgeries you have had. Any medical conditions you have. Any problems you have had with having bowel movements. Whether you are pregnant or may be pregnant. What are the risks? Generally, this is a safe procedure. However, problems may occur, including: Bleeding. Damage to your intestine. Allergic reactions to medicines given during the procedure. Infection. This is rare. What happens before the procedure? Eating and drinking restrictions Follow instructions from your health care provider about eating or drinking restrictions, which may include:  A few days before the procedure: Follow a low-fiber diet. Avoid nuts, seeds, dried fruit, raw fruits, and vegetables. 1-3 days before the procedure: Eat only gelatin dessert or ice pops. Drink only clear liquids, such as water, clear juice, clear broth or bouillon, black coffee or tea, or clear soft drinks or sports drinks. Avoid liquids that contain red or purple dye. The day of the procedure: Do not eat solid foods. You may continue to drink clear liquids until  up to 2 hours before the procedure. Do not eat or drink anything starting 2 hours before the procedure, or within the time period that your health care provider recommends. Bowel prep If you were prescribed a bowel prep to take by mouth (orally) to clean out your colon: Take it as told by your health care provider. Starting the day before your procedure, you will need to drink a large amount of liquid medicine. The liquid will cause you to have many bowel movements of loose stool until your stool becomes almost clear or light green. If your skin or the opening between the buttocks (anus) gets irritated from diarrhea, you may relieve the irritation using: Wipes with medicine in them, such as adult wet wipes with aloe and vitamin E. A product to soothe skin, such as petroleum jelly. If you vomit while drinking the bowel prep: Take a break for up to 60 minutes. Begin the bowel prep again. Call your health care provider if you keep vomiting or you cannot take the bowel prep without vomiting. To clean out your colon, you may also be given: Laxative medicines. These help you have a bowel movement. Instructions for enema use. An enema is liquid medicine injected into your rectum. Medicines Ask your health care provider about: Changing or stopping your regular medicines or supplements. This is especially important if you are taking iron supplements, diabetes medicines, or blood thinners. Taking medicines such as aspirin and ibuprofen. These medicines can thin your blood. Do not take these medicines unless your health care provider tells you to take them. Taking over-the-counter medicines, vitamins, herbs, and supplements. General instructions Ask your health care provider what steps will be taken to help prevent infection. These may include washing skin with a germ-killing soap. If you will be going home right after the procedure, plan to have a responsible adult: Take you home from the hospital or  clinic. You will not be allowed to drive. Care for you for the time you are told. What happens during the procedure?  An IV will be inserted into one of your veins. You will be given a medicine to make you fall asleep (general anesthetic). You will lie on your side with your knees bent. A lubricant will be put on the tube. Then the tube will be: Inserted into your anus. Gently eased through all parts of your large intestine. Air will be sent into your colon to keep it open. This may cause some pressure or cramping. Images will be taken with the camera and will appear on a screen. A small tissue sample may be removed to be looked at under a microscope (biopsy). The tissue may be sent to a lab for testing if any signs of problems are found. If small polyps are found, they may be removed and checked for cancer cells. When the procedure is finished, the tube will be removed. The procedure may vary among health care providers and hospitals. What happens after the procedure? Your blood pressure, heart rate, breathing rate, and  blood oxygen level will be monitored until you leave the hospital or clinic. You may have a small amount of blood in your stool. You may pass gas and have mild cramping or bloating in your abdomen. This is caused by the air that was used to open your colon during the exam. If you were given a sedative during the procedure, it can affect you for several hours. Do not drive or operate machinery until your health care provider says that it is safe. It is up to you to get the results of your procedure. Ask your health care provider, or the department that is doing the procedure, when your results will be ready. Summary A colonoscopy is a procedure to look at the entire large intestine. Follow instructions from your health care provider about eating and drinking before the procedure. If you were prescribed an oral bowel prep to clean out your colon, take it as told by your health  care provider. During the colonoscopy, a flexible tube with a camera on its end is inserted into the anus and then passed into all parts of the large intestine. This information is not intended to replace advice given to you by your health care provider. Make sure you discuss any questions you have with your health care provider. Document Revised: 07/21/2022 Document Reviewed: 01/29/2021 Elsevier Patient Education  2024 [employer]   Monitored Anesthesia Care Anesthesia refers to the techniques, procedures, and medicines that help a person stay safe and comfortable during surgery. Monitored anesthesia care, or sedation, is one type of anesthesia. You may have sedation if you do not need to be asleep for your procedure. Procedures that use sedation may include: Surgery to remove cataracts from your eyes. A dental procedure. A biopsy. This is when a tissue sample is removed and looked at under a microscope. You will be watched closely during your procedure. Your level of sedation or type of anesthesia may be changed to fit your needs. Tell a health care provider about: Any allergies you have. All medicines you are taking, including vitamins, herbs, eye drops, creams, and over-the-counter medicines. Any problems you or family members have had with anesthesia. Any bleeding problems you have. Any surgeries you have had. Any medical conditions or illnesses you have. This includes sleep apnea, cough, fever, or the flu. Whether you are pregnant or may be pregnant. Whether you use cigarettes, alcohol, or drugs. Any use of steroids, whether by mouth or as a cream. What are the risks? Your health care provider will talk with you about risks. These may include: Getting too much medicine (oversedation). Nausea. Allergic reactions to medicines. Trouble breathing. If this happens, a breathing tube may be used to help you breathe. It will be removed when you are awake and breathing on your own. Heart  trouble. Lung trouble. Confusion that gets better with time (emergence delirium). What happens before the procedure? When to stop eating and drinking Follow instructions from your health care provider about what you may eat and drink. These may include: 8 hours before your procedure Stop eating most foods. Do not eat meat, fried foods, or fatty foods. Eat only light foods, such as toast or crackers. All liquids are okay except energy drinks and alcohol. 6 hours before your procedure Stop eating. Drink only clear liquids, such as water, clear fruit juice, black coffee, plain tea, and sports drinks. Do not drink energy drinks or alcohol. 2 hours before your procedure Stop drinking all liquids. You may be allowed  to take medicines with small sips of water. If you do not follow your health care provider's instructions, your procedure may be delayed or canceled. Medicines Ask your health care provider about: Changing or stopping your regular medicines. These include any diabetes medicines or blood thinners you take. Taking medicines such as aspirin and ibuprofen. These medicines can thin your blood. Do not take them unless your health care provider tells you to. Taking over-the-counter medicines, vitamins, herbs, and supplements. Testing You may have an exam or testing. You may have a blood or urine sample taken. General instructions Do not use any products that contain nicotine or tobacco for at least 4 weeks before the procedure. These products include cigarettes, chewing tobacco, and vaping devices, such as e-cigarettes. If you need help quitting, ask your health care provider. If you will be going home right after the procedure, plan to have a responsible adult: Take you home from the hospital or clinic. You will not be allowed to drive. Care for you for the time you are told. What happens during the procedure?  Your blood pressure, heart rate, breathing, level of pain, and blood  oxygen level will be monitored. An IV will be inserted into one of your veins. You may be given: A sedative. This helps you relax. Anesthesia. This will: Numb certain areas of your body. Make you fall asleep for surgery. You will be given medicines as needed to keep you comfortable. The more medicine you are given, the deeper your level of sedation will be. Your level of sedation may be changed to fit your needs. There are three levels of sedation: Mild sedation. At this level, you may feel awake and relaxed. You will be able to follow directions. Moderate sedation. At this level, you will be sleepy. You may not remember the procedure. Deep sedation. At this level, you will be asleep. You will not remember the procedure. How you get the medicines will depend on your age and the procedure. They may be given as: A pill. This may be taken by mouth (orally) or inserted into the rectum. An injection. This may be into a vein or muscle. A spray through the nose. After your procedure is over, the medicine will be stopped. The procedure may vary among health care providers and hospitals. What happens after the procedure? Your blood pressure, heart rate, breathing rate, and blood oxygen level will be monitored until you leave the hospital or clinic. You may feel sleepy, clumsy, or nauseous. You may not remember what happened during or after the procedure. Sedation can affect you for several hours. Do not drive or use machinery until your health care provider says that it is safe. This information is not intended to replace advice given to you by your health care provider. Make sure you discuss any questions you have with your health care provider. Document Revised: 11/03/2021 Document Reviewed: 11/03/2021 Elsevier Patient Education  2024 [employer].

## 2023-06-10 ENCOUNTER — Encounter (HOSPITAL_COMMUNITY): Payer: Self-pay

## 2023-06-10 ENCOUNTER — Encounter (HOSPITAL_COMMUNITY)
Admission: RE | Disposition: A | Discharge status: Home / Self Care | Payer: Self-pay | Source: Ambulatory Visit | Attending: Gastroenterology

## 2023-06-10 ENCOUNTER — Ambulatory Visit (HOSPITAL_COMMUNITY): Payer: Commercial Managed Care - PPO | Admitting: Anesthesiology

## 2023-06-10 ENCOUNTER — Encounter (INDEPENDENT_AMBULATORY_CARE_PROVIDER_SITE_OTHER): Payer: Self-pay | Admitting: *Deleted

## 2023-06-10 ENCOUNTER — Ambulatory Visit (HOSPITAL_COMMUNITY)
Admission: RE | Admit: 2023-06-10 | Discharge: 2023-06-10 | Disposition: A | Discharge status: Home / Self Care | Payer: Commercial Managed Care - PPO | Source: Ambulatory Visit | Attending: Gastroenterology | Admitting: Gastroenterology

## 2023-06-10 ENCOUNTER — Telehealth (INDEPENDENT_AMBULATORY_CARE_PROVIDER_SITE_OTHER): Payer: Self-pay | Admitting: *Deleted

## 2023-06-10 DIAGNOSIS — F17210 Nicotine dependence, cigarettes, uncomplicated: Secondary | ICD-10-CM | POA: Diagnosis not present

## 2023-06-10 DIAGNOSIS — K648 Other hemorrhoids: Secondary | ICD-10-CM

## 2023-06-10 DIAGNOSIS — K635 Polyp of colon: Secondary | ICD-10-CM | POA: Diagnosis not present

## 2023-06-10 DIAGNOSIS — Z1211 Encounter for screening for malignant neoplasm of colon: Secondary | ICD-10-CM | POA: Diagnosis not present

## 2023-06-10 DIAGNOSIS — K644 Residual hemorrhoidal skin tags: Secondary | ICD-10-CM | POA: Insufficient documentation

## 2023-06-10 DIAGNOSIS — F17290 Nicotine dependence, other tobacco product, uncomplicated: Secondary | ICD-10-CM | POA: Diagnosis not present

## 2023-06-10 DIAGNOSIS — D123 Benign neoplasm of transverse colon: Secondary | ICD-10-CM | POA: Diagnosis not present

## 2023-06-10 DIAGNOSIS — L729 Follicular cyst of the skin and subcutaneous tissue, unspecified: Secondary | ICD-10-CM

## 2023-06-10 HISTORY — PX: POLYPECTOMY: SHX5525

## 2023-06-10 HISTORY — PX: COLONOSCOPY WITH PROPOFOL: SHX5780

## 2023-06-10 LAB — HM COLONOSCOPY

## 2023-06-10 SURGERY — COLONOSCOPY WITH PROPOFOL
Anesthesia: General

## 2023-06-10 MED ORDER — LACTATED RINGERS IV SOLN: INTRAVENOUS | Status: DC

## 2023-06-10 MED ORDER — LACTATED RINGERS IV SOLN: INTRAVENOUS | Status: DC | PRN

## 2023-06-10 MED ORDER — PROPOFOL 10 MG/ML IV BOLUS
INTRAVENOUS | Status: DC | PRN
  Administered 2023-06-10: 20 mg via INTRAVENOUS
  Administered 2023-06-10: 60 mg via INTRAVENOUS

## 2023-06-10 MED ORDER — PROPOFOL 500 MG/50ML IV EMUL
INTRAVENOUS | Status: DC | PRN
  Administered 2023-06-10: 125 ug/kg/min via INTRAVENOUS

## 2023-06-10 NOTE — H&P (Signed)
  Primary Care Physician:  Benita Stabile, MD Primary Gastroenterologist:  Dr. Tasia Catchings  Pre-Procedure History & Physical: HPI:  Joe Freeman is a 47 y.o. male is here for a colonoscopy for colon cancer screening purposes.  Patient denies any family history of colorectal cancer.  No melena or hematochezia.  No abdominal pain or unintentional weight loss.  No change in bowel habits.  Overall feels well from a GI standpoint.  Past Medical History:  Diagnosis Date   Medical history non-contributory     Past Surgical History:  Procedure Laterality Date   COLONOSCOPY     NO PAST SURGERIES      Prior to Admission medications   Not on File    Allergies as of 05/04/2023   (No Known Allergies)    History reviewed. No pertinent family history.  Social History   Socioeconomic History   Marital status: Married    Spouse name: Not on file   Number of children: Not on file   Years of education: Not on file   Highest education level: Not on file  Occupational History   Not on file  Tobacco Use   Smoking status: Light Smoker    Current packs/day: 0.25    Types: Cigars, Cigarettes   Smokeless tobacco: Not on file  Substance and Sexual Activity   Alcohol use: No   Drug use: No   Sexual activity: Not on file  Other Topics Concern   Not on file  Social History Narrative   Not on file   Social Drivers of Health   Financial Resource Strain: Not on file  Food Insecurity: Not on file  Transportation Needs: Not on file  Physical Activity: Not on file  Stress: Not on file  Social Connections: Not on file  Intimate Partner Violence: Not on file    Review of Systems: See HPI, otherwise negative ROS  Physical Exam: Vital signs in last 24 hours: Temp:  [97.9 F (36.6 C)] 97.9 F (36.6 C) (12/19 0703) Pulse Rate:  [73] 73 (12/19 0703) Resp:  [12] 12 (12/19 0703) BP: (120)/(74) 120/74 (12/19 0703) SpO2:  [96 %] 96 % (12/19 0703) Weight:  [106.5 kg] 106.5 kg (12/19 0703)    General:   Alert,  Well-developed, well-nourished, pleasant and cooperative in NAD Head:  Normocephalic and atraumatic. Eyes:  Sclera clear, no icterus.   Conjunctiva pink. Ears:  Normal auditory acuity. Nose:  No deformity, discharge,  or lesions. Msk:  Symmetrical without gross deformities. Normal posture. Extremities:  Without clubbing or edema. Neurologic:  Alert and  oriented x4;  grossly normal neurologically. Skin:  Intact without significant lesions or rashes. Psych:  Alert and cooperative. Normal mood and affect.  Impression/Plan: Joe Freeman is here for a colonoscopy to be performed for colon cancer screening purposes.  The risks of the procedure including infection, bleed, or perforation as well as benefits, limitations, alternatives and imponderables have been reviewed with the patient. Questions have been answered. All parties agreeable.

## 2023-06-10 NOTE — Telephone Encounter (Signed)
Referral sent, they will contact patient with apt

## 2023-06-10 NOTE — Discharge Instructions (Signed)

## 2023-06-10 NOTE — Telephone Encounter (Signed)
-----   Message from Franky Macho sent at 06/10/2023  8:59 AM EST ----- Regarding: referral Hi Joe Freeman  Can you refer this patient to our general surgeons for   Gluteus area lipoma/cyst , assess for removal

## 2023-06-10 NOTE — Op Note (Signed)
[facility] Patient Name: Joe Freeman Procedure Date: 06/10/2023 8:12 AM MRN: [id number] Date of Birth: [birth year] Attending MD: Sanjuan Dame , MD, [id number] CSN: [id number] Age: 47 Admit Type: Outpatient Procedure:                Colonoscopy Indications:              Screening for colorectal malignant neoplasm Providers:                Sanjuan Dame, MD, Francoise Ceo RN, RN, Zena Amos Referring MD:              Medicines:                Monitored Anesthesia Care Complications:            No immediate complications. Estimated Blood Loss:     Estimated blood loss: none. Procedure:                Pre-Anesthesia Assessment:                           - Prior to the procedure, a History and Physical                            was performed, and patient medications and                            allergies were reviewed. The patient's tolerance of                            previous anesthesia was also reviewed. The risks                            and benefits of the procedure and the sedation                            options and risks were discussed with the patient.                            All questions were answered, and informed consent                            was obtained. Prior Anticoagulants: The patient has                            taken no anticoagulant or antiplatelet agents. ASA                            Grade Assessment: II - A patient with mild systemic                            disease. After reviewing the risks and benefits,  the patient was deemed in satisfactory condition to                            undergo the procedure.                           After obtaining informed consent, the colonoscope                            was passed under direct vision. Throughout the                            procedure, the patient's blood pressure, pulse, and                            oxygen  saturations were monitored continuously. The                            [id number]) scope was introduced through the                            anus and advanced to the the cecum, identified by                            appendiceal orifice and ileocecal valve. The                            ileocecal valve, appendiceal orifice, and rectum                            were photographed. Scope In: 8:24:25 AM Scope Out: 8:48:26 AM Scope Withdrawal Time: 0 hours 17 minutes 34 seconds  Total Procedure Duration: 0 hours 24 minutes 1 second  Findings:      A 5 mm polyp was found in the transverse colon. The polyp was sessile.       The polyp was removed with a cold snare. Resection and retrieval were       complete.      Non-bleeding external and internal hemorrhoids were found during       retroflexion. The hemorrhoids were small. Impression:               - One 5 mm polyp in the transverse colon, removed                            with a cold snare. Resected and retrieved.                           - Non-bleeding external and internal hemorrhoids. Moderate Sedation:      Per Anesthesia Care Recommendation:           - Patient has a contact number available for                            emergencies. The signs and symptoms of potential  delayed complications were discussed with the                            patient. Return to normal activities tomorrow.                            Written discharge instructions were provided to the                            patient.                           - Resume previous diet.                           - Continue present medications.                           - Await pathology results.                           - Repeat colonoscopy in 7 years for surveillance.                           - Return to primary care physician as previously                            scheduled. Procedure Code(s):        --- Professional ---                            [phone], Colonoscopy, flexible; with removal of                            tumor(s), polyp(s), or other lesion(s) by snare                            technique Diagnosis Code(s):        --- Professional ---                           Z12.11, Encounter for screening for malignant                            neoplasm of colon                           D12.3, Benign neoplasm of transverse colon (hepatic                            flexure or splenic flexure)                           K64.8, Other hemorrhoids CPT copyright 2022 American Medical Association. All rights reserved. The codes documented in this report are preliminary and upon coder review may  be revised to meet current compliance requirements. Sanjuan Dame, MD Sanjuan Dame, MD 06/10/2023 8:56:07 AM  This report has been signed electronically. Number of Addenda: 0

## 2023-06-10 NOTE — Transfer of Care (Signed)
Immediate Anesthesia Transfer of Care Note  Patient: Joe Freeman  Procedure(s) Performed: COLONOSCOPY WITH PROPOFOL POLYPECTOMY  Patient Location: PACU  Anesthesia Type:General  Level of Consciousness: sedated  Airway & Oxygen Therapy: Patient Spontanous Breathing  Post-op Assessment: Report given to RN and Post -op Vital signs reviewed and stable  Post vital signs: Reviewed and stable  Last Vitals:  Vitals Value Taken Time  BP 106/68 06/10/23 0851  Temp 97.9   Pulse 78 06/10/23 0853  Resp 14 06/10/23 0853  SpO2 97 % 06/10/23 0853  Vitals shown include unfiled device data.  Last Pain:  Vitals:   06/10/23 0819  TempSrc:   PainSc: 0-No pain         Complications: No notable events documented.

## 2023-06-10 NOTE — Anesthesia Preprocedure Evaluation (Signed)
Anesthesia Evaluation  Patient identified by MRN, date of birth, ID band Patient awake    Reviewed: Allergy & Precautions, H&P , NPO status , Patient's Chart, lab work & pertinent test results, reviewed documented beta blocker date and time   Airway Mallampati: II  TM Distance: >3 FB Neck ROM: full    Dental no notable dental hx.    Pulmonary neg pulmonary ROS, Current Smoker   Pulmonary exam normal breath sounds clear to auscultation       Cardiovascular Exercise Tolerance: Good hypertension, negative cardio ROS  Rhythm:regular Rate:Normal     Neuro/Psych negative neurological ROS  negative psych ROS   GI/Hepatic negative GI ROS, Neg liver ROS,,,  Endo/Other  negative endocrine ROS    Renal/GU negative Renal ROS  negative genitourinary   Musculoskeletal   Abdominal   Peds  Hematology negative hematology ROS (+)   Anesthesia Other Findings   Reproductive/Obstetrics negative OB ROS                             Anesthesia Physical Anesthesia Plan  ASA: 2  Anesthesia Plan: General   Post-op Pain Management:    Induction:   PONV Risk Score and Plan: Propofol infusion  Airway Management Planned:   Additional Equipment:   Intra-op Plan:   Post-operative Plan:   Informed Consent: I have reviewed the patients History and Physical, chart, labs and discussed the procedure including the risks, benefits and alternatives for the proposed anesthesia with the patient or authorized representative who has indicated his/her understanding and acceptance.     Dental Advisory Given  Plan Discussed with: CRNA  Anesthesia Plan Comments:        Anesthesia Quick Evaluation

## 2023-06-11 LAB — SURGICAL PATHOLOGY

## 2023-06-11 NOTE — Anesthesia Postprocedure Evaluation (Signed)
Anesthesia Post Note  Patient: Joe Freeman  Procedure(s) Performed: COLONOSCOPY WITH PROPOFOL POLYPECTOMY  Patient location during evaluation: Phase II Anesthesia Type: General Level of consciousness: awake Pain management: pain level controlled Vital Signs Assessment: post-procedure vital signs reviewed and stable Respiratory status: spontaneous breathing and respiratory function stable Cardiovascular status: blood pressure returned to baseline and stable Postop Assessment: no headache and no apparent nausea or vomiting Anesthetic complications: no Comments: Late entry   No notable events documented.   Last Vitals:  Vitals:   06/10/23 0703 06/10/23 0851  BP: 120/74 106/68  Pulse: 73 76  Resp: 12 18  Temp: 36.6 C 36.6 C  SpO2: 96% 95%    Last Pain:  Vitals:   06/10/23 0851  TempSrc: Axillary  PainSc: 0-No pain                 Windell Norfolk

## 2023-06-17 ENCOUNTER — Encounter (INDEPENDENT_AMBULATORY_CARE_PROVIDER_SITE_OTHER): Payer: Self-pay | Admitting: *Deleted

## 2023-06-17 NOTE — Progress Notes (Signed)
I reviewed the pathology results. Ann, can you send her a letter with the findings as described below please?  Repeat colonoscopy in 5 years  Thanks,  Vista Lawman, MD Gastroenterology and Hepatology [facility]  ---------------------------------------------------------------------------------------------  [facility] [address], [place], [place] [address] Phone:  [phone]   06/17/23 [place], [place]   Dear Sherlynn Carbon,  I am writing to inform you that the biopsies taken during your recent endoscopic examination showed:  I am writing to let you know the results of your recent colonoscopy.  You had a total of 1 polyps removed. The pathology came back as "sessile serrated" polyp. . These findings are NOT cancer, but had the polyps remained in your colon, they could have turn into cancer.  Given these findings, it is recommended that your next colonoscopy be performed in 5 years.  Also I value your feedback , so if you get a survey , please take the time to fill it out and thank you for choosing [facility]  Please call us at [phone] if you have persistent problems or have questions about your condition that have not been fully answered at this time.  Sincerely,  Vista Lawman, MD Gastroenterology and Hepatology

## 2023-06-22 ENCOUNTER — Encounter: Payer: Self-pay | Admitting: *Deleted

## 2023-07-01 ENCOUNTER — Encounter (HOSPITAL_COMMUNITY): Payer: Self-pay | Admitting: Gastroenterology

## 2023-07-22 ENCOUNTER — Ambulatory Visit: Payer: Commercial Managed Care - PPO | Admitting: General Surgery

## 2023-07-22 ENCOUNTER — Encounter: Payer: Self-pay | Admitting: General Surgery

## 2023-07-22 VITALS — BP 139/85 | HR 79 | Temp 98.4°F | Resp 14 | Ht 72.0 in | Wt 246.0 lb

## 2023-07-22 DIAGNOSIS — D171 Benign lipomatous neoplasm of skin and subcutaneous tissue of trunk: Secondary | ICD-10-CM

## 2023-07-22 DIAGNOSIS — L723 Sebaceous cyst: Secondary | ICD-10-CM | POA: Diagnosis not present

## 2023-07-23 NOTE — Progress Notes (Signed)
Joe Freeman; [id number]; [birth year]   HPI Patient is a 48 year old black male who was referred to my care by Dr. Nita Sells for evaluation treatment of a lipoma on the right buttock.  Patient states has been there for many years but is starting to cause him discomfort.  He also reports a cyst on his back.  Patient does not know whether it has increased in size recently. Past Medical History:  Diagnosis Date   Medical history non-contributory     Past Surgical History:  Procedure Laterality Date   COLONOSCOPY     COLONOSCOPY WITH PROPOFOL N/A 06/10/2023   Procedure: COLONOSCOPY WITH PROPOFOL;  Surgeon: Franky Macho, MD;  [facility]: [facility];  Service: Endoscopy;  Laterality: N/A;  8:30AM;ASA 1-2   NO PAST SURGERIES     POLYPECTOMY  06/10/2023   Procedure: POLYPECTOMY;  Surgeon: Franky Macho, MD;  Location: [facility];  Service: Endoscopy;;    History reviewed. No pertinent family history.  No current outpatient medications on file prior to visit.   No current facility-administered medications on file prior to visit.    No Known Allergies  Social History   Substance and Sexual Activity  Alcohol Use No    Social History   Tobacco Use  Smoking Status Former   Current packs/day: 0.00   Types: Cigars, Cigarettes   Quit date: 2015   Years since quitting: 10.0  Smokeless Tobacco Not on file    Review of Systems  Constitutional: Negative.   HENT: Negative.    Eyes: Negative.   Respiratory: Negative.    Cardiovascular: Negative.   Gastrointestinal: Negative.   Genitourinary: Negative.   Musculoskeletal: Negative.   Skin: Negative.   Neurological: Negative.   Endo/Heme/Allergies: Negative.   Psychiatric/Behavioral: Negative.      Objective   Vitals:   07/22/23 1056  BP: 139/85  Pulse: 79  Resp: 14  Temp: 98.4 F (36.9 C)  SpO2: 94%    Physical Exam Vitals reviewed.  Constitutional:      Appearance: Normal appearance. He is not  ill-appearing.  HENT:     Head: Normocephalic and atraumatic.  Cardiovascular:     Rate and Rhythm: Normal rate and regular rhythm.     Heart sounds: Normal heart sounds. No murmur heard.    No friction rub. No gallop.  Pulmonary:     Effort: Pulmonary effort is normal. No respiratory distress.     Breath sounds: Normal breath sounds. No stridor. No wheezing, rhonchi or rales.  Skin:    General: Skin is warm and dry.     Comments: 2 cm lipomatous lesion along the right inferior buttock fold.  A 2-1/2 cm sebaceous cyst is noted in the mid back.  Mild erythema is noted.  No purulent drainage noted.  Punctum present.  Neurological:     Mental Status: He is alert and oriented to person, place, and time.     Assessment  Lipoma, right buttock Sebaceous cyst, back Plan  Patient is scheduled for excision of lipoma right buttock and excision of sebaceous cyst on his back on 09/10/2023.  The risks and benefits of the procedures including bleeding, infection, and recurrence were fully explained to the patient, who gave informed consent.

## 2023-08-18 NOTE — H&P (Signed)
 Joe Freeman; [id number]; [birth year]   HPI Patient is a 48 year old black male who was referred to my care by Dr. Nita Sells for evaluation treatment of a lipoma on the right buttock.  Patient states has been there for many years but is starting to cause him discomfort.  He also reports a cyst on his back.  Patient does not know whether it has increased in size recently. Past Medical History:  Diagnosis Date   Medical history non-contributory     Past Surgical History:  Procedure Laterality Date   COLONOSCOPY     COLONOSCOPY WITH PROPOFOL N/A 06/10/2023   Procedure: COLONOSCOPY WITH PROPOFOL;  Surgeon: Franky Macho, MD;  [facility]: [facility];  Service: Endoscopy;  Laterality: N/A;  8:30AM;ASA 1-2   NO PAST SURGERIES     POLYPECTOMY  06/10/2023   Procedure: POLYPECTOMY;  Surgeon: Franky Macho, MD;  Location: [facility];  Service: Endoscopy;;    History reviewed. No pertinent family history.  No current outpatient medications on file prior to visit.   No current facility-administered medications on file prior to visit.    No Known Allergies  Social History   Substance and Sexual Activity  Alcohol Use No    Social History   Tobacco Use  Smoking Status Former   Current packs/day: 0.00   Types: Cigars, Cigarettes   Quit date: 2015   Years since quitting: 10.0  Smokeless Tobacco Not on file    Review of Systems  Constitutional: Negative.   HENT: Negative.    Eyes: Negative.   Respiratory: Negative.    Cardiovascular: Negative.   Gastrointestinal: Negative.   Genitourinary: Negative.   Musculoskeletal: Negative.   Skin: Negative.   Neurological: Negative.   Endo/Heme/Allergies: Negative.   Psychiatric/Behavioral: Negative.      Objective   Vitals:   07/22/23 1056  BP: 139/85  Pulse: 79  Resp: 14  Temp: 98.4 F (36.9 C)  SpO2: 94%    Physical Exam Vitals reviewed.  Constitutional:      Appearance: Normal appearance. He is not  ill-appearing.  HENT:     Head: Normocephalic and atraumatic.  Cardiovascular:     Rate and Rhythm: Normal rate and regular rhythm.     Heart sounds: Normal heart sounds. No murmur heard.    No friction rub. No gallop.  Pulmonary:     Effort: Pulmonary effort is normal. No respiratory distress.     Breath sounds: Normal breath sounds. No stridor. No wheezing, rhonchi or rales.  Skin:    General: Skin is warm and dry.     Comments: 2 cm lipomatous lesion along the right inferior buttock fold.  A 2-1/2 cm sebaceous cyst is noted in the mid back.  Mild erythema is noted.  No purulent drainage noted.  Punctum present.  Neurological:     Mental Status: He is alert and oriented to person, place, and time.     Assessment  Lipoma, right buttock Sebaceous cyst, back Plan  Patient is scheduled for excision of lipoma right buttock and excision of sebaceous cyst on his back on 09/10/2023.  The risks and benefits of the procedures including bleeding, infection, and recurrence were fully explained to the patient, who gave informed consent.

## 2023-09-08 ENCOUNTER — Encounter (HOSPITAL_COMMUNITY)
Admission: RE | Admit: 2023-09-08 | Discharge: 2023-09-08 | Disposition: A | Discharge status: Home / Self Care | Payer: Commercial Managed Care - PPO | Source: Ambulatory Visit | Attending: General Surgery | Admitting: General Surgery

## 2023-09-08 ENCOUNTER — Encounter (HOSPITAL_COMMUNITY): Payer: Self-pay

## 2023-09-08 VITALS — BP 118/91 | HR 85 | Temp 98.5°F | Resp 18 | Ht 72.0 in | Wt 246.0 lb

## 2023-09-08 DIAGNOSIS — E663 Overweight: Secondary | ICD-10-CM

## 2023-09-08 HISTORY — DX: Ulcerative colitis, unspecified, without complications: K51.90

## 2023-09-08 NOTE — Patient Instructions (Signed)
 Joe Freeman  09/08/2023     @PREFPERIOPPHARMACY @   Your procedure is scheduled on 09/10/2023.   Report to [facility] at 6:00 A.M.   Call this number if you have problems the morning of surgery:   [phone]  If you experience any cold or flu symptoms such as cough, fever, chills, shortness of breath, etc. between now and your scheduled surgery, please notify us at the above number.   Remember:   Do not eat after midnight.   You may drink clear liquids until 3:30 AM .  Clear liquids allowed are:                    Water, Juice (No red color; non-citric and without pulp; diabetics please choose diet or no sugar options), Clear Tea (No creamer, milk, or cream, including half & half and powdered creamer), and Clear Sports drink (No red color; diabetics please choose diet or no sugar options)    Take these medicines the morning of surgery with A SIP OF WATER : none    Do not wear jewelry, make-up or nail polish, including gel polish,  artificial nails, or any other type of covering on natural nails (fingers and  toes).  Do not wear lotions, powders, or perfumes, or deodorant.  Do not shave 48 hours prior to surgery.  Men may shave face and neck.  Do not bring valuables to the hospital.  [facility] is not responsible for any belongings or valuables.  Contacts, dentures or bridgework may not be worn into surgery.  Leave your suitcase in the car.  After surgery it may be brought to your room.  For patients admitted to the hospital, discharge time will be determined by your treatment team.  Patients discharged the day of surgery will not be allowed to drive home.   Name and phone number of your driver:   family  Special instructions:  n/a  Please read over the following fact sheets that you were given. Care and Recovery After Surgery    Lipoma Removal  Lipoma removal is a surgical procedure to remove a lipoma, which is a noncancerous (benign) tumor that is made up of fat  cells. Most lipomas are small and painless and do not require treatment. They can form in many areas of the body but are most common under the skin of the back, arms, shoulders, buttocks, and thighs. You may need lipoma removal if you have a lipoma that is large, growing, or causing discomfort. Lipoma removal may also be done for cosmetic reasons. Tell a health care provider about: Any allergies you have. All medicines you are taking, including vitamins, herbs, eye drops, creams, and over-the-counter medicines. Any problems you or family members have had with anesthetic medicines. Any bleeding problems you have. Any surgeries you have had. Any medical conditions you have. Whether you are pregnant or may be pregnant. What are the risks? Generally, this is a safe procedure. However, problems may occur, including: Infection. Bleeding. Scarring. Allergic reactions to medicines. Damage to nearby structures or organs, such as damage to nerves or blood vessels near the lipoma. What happens before the procedure? When to Stop Eating and Drinking Follow instructions from your health care provider about what you may eat and drink before your procedure. These may include: 8 hours before your procedure Stop eating most foods. Do not eat meat, fried foods, or fatty foods. Eat only light foods, such as toast or crackers. All liquids are okay  except energy drinks and alcohol. 6 hours before your procedure Stop eating. Drink only clear liquids, such as water, clear fruit juice, black coffee, plain tea, and sports drinks. Do not drink energy drinks or alcohol. 2 hours before your procedure Stop drinking all liquids. You may be allowed to take medicines with small sips of water. If you do not follow your health care provider's instructions, your procedure may be delayed or canceled. Medicines Ask your health care provider about: Changing or stopping your regular medicines. This is especially important  if you are taking diabetes medicines or blood thinners. Taking medicines such as aspirin and ibuprofen. These medicines can thin your blood. Do not take these medicines unless your health care provider tells you to take them. Taking over-the-counter medicines, vitamins, herbs, and supplements. General instructions You will have a physical exam. Your health care provider will check the size of the lipoma and whether it can be removed easily. You may have a biopsy and imaging tests, such as X-rays, a CT scan, and an MRI. Do not use any products that contain nicotine or tobacco for at least 4 weeks before the procedure. These products include cigarettes, chewing tobacco, and vaping devices, such as e-cigarettes. If you need help quitting, ask your health care provider. Ask your health care provider: How your surgery site will be marked. What steps will be taken to help prevent infection. These may include: Washing skin with a germ-killing soap. Taking antibiotic medicine. If you will be going home right after the procedure, plan to have a responsible adult: Take you home from the hospital or clinic. You will not be allowed to drive. Care for you for the time you are told. What happens during the procedure?  An IV will be inserted into one of your veins. You will be given one or more of the following: A medicine to help you relax (sedative). A medicine to numb the area (local anesthetic). A medicine to make you fall asleep (general anesthetic). A medicine that is injected into an area of your body to numb everything below the injection site (regional anesthetic). An incision will be made into the skin over the lipoma or very near the lipoma. The incision may be made in a natural skin line or crease. Tissues, nerves, and blood vessels near the lipoma will be moved out of the way. The lipoma and the capsule that surrounds it will be separated from the surrounding tissues. The lipoma will be  removed. The incision may be closed with stitches (sutures). A bandage (dressing) will be placed over the incision. The procedure may vary among health care providers and hospitals. What happens after the procedure? Your blood pressure, heart rate, breathing rate, and blood oxygen level will be monitored until you leave the hospital or clinic. If you were prescribed an antibiotic medicine, use it as told by your health care provider. Do not stop using the antibiotic even if you start to feel better. If you were given a sedative during the procedure, it can affect you for several hours. Do not drive or operate machinery until your health care provider says that it is safe. Where to find more information OrthoInfo: orthoinfo.aaos.org Summary Before the procedure, follow instructions from your health care provider about eating and drinking, and changing or stopping your regular medicines. This is especially important if you are taking diabetes medicines or blood thinners. After the lipoma is removed, the incision may be closed with stitches (sutures) and covered with a bandage (  dressing). If you were given a sedative during the procedure, it can affect you for several hours. Do not drive or operate machinery until your health care provider says that it is safe. This information is not intended to replace advice given to you by your health care provider. Make sure you discuss any questions you have with your health care provider. Document Revised: 06/27/2021 Document Reviewed: 06/27/2021 Elsevier Patient Education  2024 [employer] Inc.  General Anesthesia, Adult General anesthesia is the use of medicine to make you fall asleep (unconscious) for a medical procedure. General anesthesia must be used for certain procedures. It is often recommended for surgery or procedures that: Last a long time. Require you to be still or in an unusual position. Are major and can cause blood loss. Affect your  breathing. The medicines used for general anesthesia are called general anesthetics. During general anesthesia, these medicines are given along with medicines that: Prevent pain. Control your blood pressure. Relax your muscles. Prevent nausea and vomiting after the procedure. Tell a health care provider about: Any allergies you have. All medicines you are taking, including vitamins, herbs, eye drops, creams, and over-the-counter medicines. Your history of any: Medical conditions you have, including: High blood pressure. Bleeding problems. Diabetes. Heart or lung conditions, such as: Heart failure. Sleep apnea. Asthma. Chronic obstructive pulmonary disease (COPD). Current or recent illnesses, such as: Upper respiratory, chest, or ear infections. Cough or fever. Tobacco or drug use, including marijuana or alcohol use. Depression or anxiety. Surgeries and types of anesthetics you have had. Problems you or family members have had with anesthetic medicines. Whether you are pregnant or may be pregnant. Whether you have any chipped or loose teeth, dentures, caps, bridgework, or issues with your mouth, swallowing, or choking. What are the risks? Your health care provider will talk with you about risks. These may include: Allergic reaction to the medicines. Lung and heart problems. Inhaling food or liquid from the stomach into the lungs (aspiration). Nerve injury. Injury to the lips, mouth, teeth, or gums. Stroke. Waking up during your procedure and being unable to move. This is rare. These problems are more likely to develop if you are having a major surgery or if you have an advanced or serious medical condition. You can prevent some of these complications by answering all of your health care provider's questions thoroughly and by following all instructions before your procedure. General anesthesia can cause side effects, including: Nausea or vomiting. A sore throat or hoarseness  from the breathing tube. Wheezing or coughing. Shaking chills or feeling cold. Body aches. Sleepiness. Confusion, agitation (delirium), or anxiety. What happens before the procedure? When to stop eating and drinking Follow instructions from your health care provider about what you may eat and drink before your procedure. If you do not follow your health care provider's instructions, your procedure may be delayed or canceled. Medicines Ask your health care provider about: Changing or stopping your regular medicines. These include any diabetes medicines or blood thinners you take. Taking medicines such as aspirin and ibuprofen. These medicines can thin your blood. Do not take them unless your health care provider tells you to. Taking over-the-counter medicines, vitamins, herbs, and supplements. General instructions Do not use any products that contain nicotine or tobacco for at least 4 weeks before the procedure. These products include cigarettes, chewing tobacco, and vaping devices, such as e-cigarettes. If you need help quitting, ask your health care provider. If you brush your teeth on the morning of the  procedure, make sure to spit out all of the water and toothpaste. If told by your health care provider, bring your sleep apnea device with you to surgery (if applicable). If you will be going home right after the procedure, plan to have a responsible adult: Take you home from the hospital or clinic. You will not be allowed to drive. Care for you for the time you are told. What happens during the procedure?  An IV will be inserted into one of your veins. You will be given one or more of the following through a face mask or IV: A sedative. This helps you relax. Anesthesia. This will: Numb certain areas of your body. Make you fall asleep for surgery. After you are unconscious, a breathing tube may be inserted down your throat to help you breathe. This will be removed before you wake  up. An anesthesia provider, such as an anesthesiologist, will stay with you throughout your procedure. The anesthesia provider will: Keep you comfortable and safe by continuing to give you medicines and adjusting the amount of medicine that you get. Monitor your blood pressure, heart rate, and oxygen levels to make sure that the anesthetics do not cause any problems. The procedure may vary among health care providers and hospitals. What happens after the procedure? Your blood pressure, temperature, heart rate, breathing rate, and blood oxygen level will be monitored until you leave the hospital or clinic. You will wake up in a recovery area. You may wake up slowly. You may be given medicine to help you with pain, nausea, or any other side effects from the anesthesia. Summary General anesthesia is the use of medicine to make you fall asleep (unconscious) for a medical procedure. Follow your health care provider's instructions about when to stop eating, drinking, or taking certain medicines before your procedure. Plan to have a responsible adult take you home from the hospital or clinic. This information is not intended to replace advice given to you by your health care provider. Make sure you discuss any questions you have with your health care provider. Document Revised: 09/04/2021 Document Reviewed: 09/04/2021 Elsevier Patient Education  2024 Elsevier Inc.  How to Use Chlorhexidine at Home in the Shower Chlorhexidine gluconate (CHG) is a germ-killing (antiseptic) wash that's used to clean the skin. It can get rid of the germs that normally live on the skin and can keep them away for about 24 hours. If you're having surgery, you may be told to shower with CHG at home the night before surgery. This can help lower your risk for infection. To use CHG wash in the shower, follow the steps below. Supplies needed: CHG body wash. Clean washcloth. Clean towel. How to use CHG in the shower Follow  these steps unless you're told to use CHG in a different way: Start the shower. Use your normal soap and shampoo to wash your face and hair. Turn off the shower or move out of the shower stream. Pour CHG onto a clean washcloth. Do not use any type of brush or rough sponge. Start at your neck, washing your body down to your toes. Make sure you: Wash the part of your body where the surgery will be done for at least 1 minute. Do not scrub. Do not use CHG on your head or face unless your health care provider tells you to. If it gets into your ears or eyes, rinse them well with water. Do not wash your genitals with CHG. Wash your back and under  your arms. Make sure to wash skin folds. Let the CHG sit on your skin for 1-2 minutes or as long as told. Rinse your entire body in the shower, including all body creases and folds. Turn off the shower. Dry off with a clean towel. Do not put anything on your skin afterward, such as powder, lotion, or perfume. Put on clean clothes or pajamas. If it's the night before surgery, sleep in clean sheets. General tips Use CHG only as told, and follow the instructions on the label. Use the full amount of CHG as told. This is often one bottle. Do not smoke and stay away from flames after using CHG. Your skin may feel sticky after using CHG. This is normal. The sticky feeling will go away as the CHG dries. Do not use CHG: If you have a chlorhexidine allergy or have reacted to chlorhexidine in the past. On open wounds or areas of skin that have broken skin, cuts, or scrapes. On babies younger than 22 months of age. Contact a health care provider if: You have questions about using CHG. Your skin gets irritated or itchy. You have a rash after using CHG. You swallow any CHG. Call your local poison control center [phone] in the [place] Your eyes itch badly, or they become very red or swollen. Your hearing changes. You have trouble seeing. If you can't reach  your provider, go to an urgent care or emergency room. Do not drive yourself. Get help right away if: You have swelling or tingling in your mouth or throat. You make high-pitched whistling sounds when you breathe, most often when you breathe out (wheeze). You have trouble breathing. These symptoms may be an emergency. Call 911 right away. Do not wait to see if the symptoms will go away. Do not drive yourself to the hospital. This information is not intended to replace advice given to you by your health care provider. Make sure you discuss any questions you have with your health care provider. Document Revised: 12/22/2022 Document Reviewed: 12/18/2021 Elsevier Patient Education  2024 [employer].

## 2023-09-09 NOTE — Anesthesia Preprocedure Evaluation (Signed)
 Anesthesia Evaluation  Patient identified by MRN, date of birth, ID band Patient awake    Reviewed: Allergy & Precautions, H&P , NPO status , Patient's Chart, lab work & pertinent test results, reviewed documented beta blocker date and time   Airway Mallampati: II  TM Distance: >3 FB Neck ROM: full    Dental no notable dental hx. (+) Dental Advisory Given, Teeth Intact   Pulmonary neg pulmonary ROS, former smoker   Pulmonary exam normal breath sounds clear to auscultation       Cardiovascular Exercise Tolerance: Good hypertension, negative cardio ROS Normal cardiovascular exam Rhythm:regular Rate:Normal     Neuro/Psych negative neurological ROS  negative psych ROS   GI/Hepatic Neg liver ROS, PUD,,,UC   Endo/Other  negative endocrine ROS    Renal/GU negative Renal ROS  negative genitourinary   Musculoskeletal   Abdominal   Peds  Hematology negative hematology ROS (+)   Anesthesia Other Findings   Reproductive/Obstetrics negative OB ROS                             Anesthesia Physical Anesthesia Plan  ASA: 2  Anesthesia Plan: General   Post-op Pain Management: Minimal or no pain anticipated   Induction:   PONV Risk Score and Plan: Ondansetron, Dexamethasone and Midazolam  Airway Management Planned: Oral ETT  Additional Equipment: None  Intra-op Plan:   Post-operative Plan: Extubation in OR  Informed Consent: I have reviewed the patients History and Physical, chart, labs and discussed the procedure including the risks, benefits and alternatives for the proposed anesthesia with the patient or authorized representative who has indicated his/her understanding and acceptance.     Dental Advisory Given  Plan Discussed with: CRNA  Anesthesia Plan Comments:        Anesthesia Quick Evaluation

## 2023-09-10 ENCOUNTER — Ambulatory Visit (HOSPITAL_COMMUNITY)
Admission: RE | Admit: 2023-09-10 | Discharge: 2023-09-10 | Disposition: A | Discharge status: Home / Self Care | Payer: Commercial Managed Care - PPO | Attending: General Surgery | Admitting: General Surgery

## 2023-09-10 ENCOUNTER — Encounter (HOSPITAL_COMMUNITY)
Admission: RE | Disposition: A | Discharge status: Home / Self Care | Payer: Self-pay | Source: Home / Self Care | Attending: General Surgery

## 2023-09-10 ENCOUNTER — Ambulatory Visit (HOSPITAL_BASED_OUTPATIENT_CLINIC_OR_DEPARTMENT_OTHER): Admitting: Anesthesiology

## 2023-09-10 ENCOUNTER — Other Ambulatory Visit: Payer: Self-pay

## 2023-09-10 ENCOUNTER — Ambulatory Visit (HOSPITAL_COMMUNITY): Admitting: Anesthesiology

## 2023-09-10 ENCOUNTER — Encounter (HOSPITAL_COMMUNITY): Payer: Self-pay | Admitting: General Surgery

## 2023-09-10 DIAGNOSIS — I10 Essential (primary) hypertension: Secondary | ICD-10-CM | POA: Insufficient documentation

## 2023-09-10 DIAGNOSIS — L720 Epidermal cyst: Secondary | ICD-10-CM | POA: Diagnosis not present

## 2023-09-10 DIAGNOSIS — E663 Overweight: Secondary | ICD-10-CM

## 2023-09-10 DIAGNOSIS — D1723 Benign lipomatous neoplasm of skin and subcutaneous tissue of right leg: Secondary | ICD-10-CM

## 2023-09-10 DIAGNOSIS — L723 Sebaceous cyst: Secondary | ICD-10-CM | POA: Diagnosis not present

## 2023-09-10 DIAGNOSIS — Z87891 Personal history of nicotine dependence: Secondary | ICD-10-CM | POA: Diagnosis not present

## 2023-09-10 DIAGNOSIS — D171 Benign lipomatous neoplasm of skin and subcutaneous tissue of trunk: Secondary | ICD-10-CM | POA: Diagnosis not present

## 2023-09-10 HISTORY — PX: MASS EXCISION: SHX2000

## 2023-09-10 HISTORY — PX: LIPOMA EXCISION: SHX5283

## 2023-09-10 SURGERY — EXCISION LIPOMA
Anesthesia: General | Site: Buttocks | Laterality: Right

## 2023-09-10 MED ORDER — CHLORHEXIDINE GLUCONATE CLOTH 2 % EX PADS: 6.0000 | MEDICATED_PAD | Freq: Once | CUTANEOUS | Status: DC

## 2023-09-10 MED ORDER — ORAL CARE MOUTH RINSE: 15.0000 mL | Freq: Once | OROMUCOSAL | Status: AC

## 2023-09-10 MED ORDER — PROPOFOL 10 MG/ML IV BOLUS
INTRAVENOUS | Status: AC
  Filled 2023-09-10: qty 20

## 2023-09-10 MED ORDER — CEFAZOLIN SODIUM-DEXTROSE 2-4 GM/100ML-% IV SOLN
2.0000 g | INTRAVENOUS | Status: AC
  Administered 2023-09-10: 2 g via INTRAVENOUS
  Filled 2023-09-10: qty 100

## 2023-09-10 MED ORDER — KETOROLAC TROMETHAMINE 30 MG/ML IJ SOLN
INTRAMUSCULAR | Status: AC
Start: 2023-09-10 — End: ?
  Filled 2023-09-10: qty 1

## 2023-09-10 MED ORDER — DEXMEDETOMIDINE HCL IN NACL 80 MCG/20ML IV SOLN
INTRAVENOUS | Status: AC
  Filled 2023-09-10: qty 20

## 2023-09-10 MED ORDER — PHENYLEPHRINE 80 MCG/ML (10ML) SYRINGE FOR IV PUSH (FOR BLOOD PRESSURE SUPPORT)
PREFILLED_SYRINGE | INTRAVENOUS | Status: AC
  Filled 2023-09-10: qty 10

## 2023-09-10 MED ORDER — DEXAMETHASONE SODIUM PHOSPHATE 10 MG/ML IJ SOLN
INTRAMUSCULAR | Status: DC | PRN
  Administered 2023-09-10: 5 mg via INTRAVENOUS

## 2023-09-10 MED ORDER — PHENYLEPHRINE 80 MCG/ML (10ML) SYRINGE FOR IV PUSH (FOR BLOOD PRESSURE SUPPORT)
PREFILLED_SYRINGE | INTRAVENOUS | Status: DC | PRN
  Administered 2023-09-10 (×2): 80 ug via INTRAVENOUS

## 2023-09-10 MED ORDER — 0.9 % SODIUM CHLORIDE (POUR BTL) OPTIME
TOPICAL | Status: DC | PRN
  Administered 2023-09-10: 500 mL

## 2023-09-10 MED ORDER — ROCURONIUM BROMIDE 10 MG/ML (PF) SYRINGE
PREFILLED_SYRINGE | INTRAVENOUS | Status: AC
  Filled 2023-09-10: qty 10

## 2023-09-10 MED ORDER — FENTANYL CITRATE PF 50 MCG/ML IJ SOSY: 25.0000 ug | PREFILLED_SYRINGE | INTRAMUSCULAR | Status: DC | PRN

## 2023-09-10 MED ORDER — BUPIVACAINE HCL (PF) 0.5 % IJ SOLN
INTRAMUSCULAR | Status: AC
  Filled 2023-09-10: qty 30

## 2023-09-10 MED ORDER — SUGAMMADEX SODIUM 200 MG/2ML IV SOLN
INTRAVENOUS | Status: DC | PRN
  Administered 2023-09-10: 200 mg via INTRAVENOUS

## 2023-09-10 MED ORDER — LIDOCAINE HCL (PF) 2 % IJ SOLN
INTRAMUSCULAR | Status: DC | PRN
  Administered 2023-09-10: 100 mg via INTRADERMAL

## 2023-09-10 MED ORDER — KETOROLAC TROMETHAMINE 30 MG/ML IJ SOLN
INTRAMUSCULAR | Status: DC | PRN
  Administered 2023-09-10: 30 mg via INTRAVENOUS

## 2023-09-10 MED ORDER — PROPOFOL 10 MG/ML IV BOLUS
INTRAVENOUS | Status: AC
Start: 2023-09-10 — End: ?
  Filled 2023-09-10: qty 20

## 2023-09-10 MED ORDER — MIDAZOLAM HCL 2 MG/2ML IJ SOLN
INTRAMUSCULAR | Status: AC
Start: 2023-09-10 — End: ?
  Filled 2023-09-10: qty 2

## 2023-09-10 MED ORDER — SODIUM CHLORIDE 0.9 % IV SOLN: 12.5000 mg | INTRAVENOUS | Status: DC | PRN

## 2023-09-10 MED ORDER — MIDAZOLAM HCL 2 MG/2ML IJ SOLN
INTRAMUSCULAR | Status: DC | PRN
  Administered 2023-09-10: 2 mg via INTRAVENOUS

## 2023-09-10 MED ORDER — LIDOCAINE HCL (PF) 2 % IJ SOLN
INTRAMUSCULAR | Status: AC
  Filled 2023-09-10: qty 5

## 2023-09-10 MED ORDER — HYDROCODONE-ACETAMINOPHEN 7.5-325 MG PO TABS: 1.0000 | ORAL_TABLET | Freq: Once | ORAL | Status: DC | PRN

## 2023-09-10 MED ORDER — CHLORHEXIDINE GLUCONATE CLOTH 2 % EX PADS
6.0000 | MEDICATED_PAD | Freq: Once | CUTANEOUS | Status: AC
  Administered 2023-09-10: 6 via TOPICAL

## 2023-09-10 MED ORDER — BUPIVACAINE HCL (PF) 0.5 % IJ SOLN
INTRAMUSCULAR | Status: DC | PRN
  Administered 2023-09-10: 5 mL
  Administered 2023-09-10: 15 mL

## 2023-09-10 MED ORDER — FENTANYL CITRATE (PF) 100 MCG/2ML IJ SOLN
INTRAMUSCULAR | Status: DC | PRN
  Administered 2023-09-10 (×2): 50 ug via INTRAVENOUS

## 2023-09-10 MED ORDER — DEXAMETHASONE SODIUM PHOSPHATE 10 MG/ML IJ SOLN
INTRAMUSCULAR | Status: AC
  Filled 2023-09-10: qty 1

## 2023-09-10 MED ORDER — PROPOFOL 10 MG/ML IV BOLUS
INTRAVENOUS | Status: DC | PRN
  Administered 2023-09-10: 200 mg via INTRAVENOUS

## 2023-09-10 MED ORDER — SUGAMMADEX SODIUM 200 MG/2ML IV SOLN
INTRAVENOUS | Status: AC
  Filled 2023-09-10: qty 2

## 2023-09-10 MED ORDER — GLYCOPYRROLATE PF 0.2 MG/ML IJ SOSY
PREFILLED_SYRINGE | INTRAMUSCULAR | Status: AC
  Filled 2023-09-10: qty 1

## 2023-09-10 MED ORDER — CHLORHEXIDINE GLUCONATE 0.12 % MT SOLN
15.0000 mL | Freq: Once | OROMUCOSAL | Status: AC
  Administered 2023-09-10: 15 mL via OROMUCOSAL
  Filled 2023-09-10: qty 15

## 2023-09-10 MED ORDER — LACTATED RINGERS IV SOLN: INTRAVENOUS | Status: DC

## 2023-09-10 MED ORDER — ONDANSETRON HCL 4 MG/2ML IJ SOLN
INTRAMUSCULAR | Status: DC | PRN
  Administered 2023-09-10: 4 mg via INTRAVENOUS

## 2023-09-10 MED ORDER — DEXMEDETOMIDINE HCL IN NACL 80 MCG/20ML IV SOLN
INTRAVENOUS | Status: DC | PRN
Start: 2023-09-10 — End: 2023-09-10
  Administered 2023-09-10 (×2): 8 ug via INTRAVENOUS

## 2023-09-10 MED ORDER — OXYCODONE HCL 5 MG PO TABS: 5.0000 mg | ORAL_TABLET | ORAL | 0 refills | Status: AC | PRN

## 2023-09-10 MED ORDER — GLYCOPYRROLATE PF 0.2 MG/ML IJ SOSY
PREFILLED_SYRINGE | INTRAMUSCULAR | Status: DC | PRN
  Administered 2023-09-10 (×2): .1 mg via INTRAVENOUS

## 2023-09-10 MED ORDER — FENTANYL CITRATE (PF) 100 MCG/2ML IJ SOLN
INTRAMUSCULAR | Status: AC
  Filled 2023-09-10: qty 2

## 2023-09-10 MED ORDER — ROCURONIUM BROMIDE 10 MG/ML (PF) SYRINGE
PREFILLED_SYRINGE | INTRAVENOUS | Status: DC | PRN
  Administered 2023-09-10: 50 mg via INTRAVENOUS

## 2023-09-10 MED ORDER — ONDANSETRON HCL 4 MG/2ML IJ SOLN
INTRAMUSCULAR | Status: AC
  Filled 2023-09-10: qty 2

## 2023-09-10 SURGICAL SUPPLY — 26 items
APPLICATOR CHLORAPREP 10.5 ORG (MISCELLANEOUS) ×2 IMPLANT
BLADE SURG SZ11 CARB STEEL (BLADE) IMPLANT
CHLORAPREP W/TINT 26 (MISCELLANEOUS) ×2 IMPLANT
CLOTH BEACON ORANGE TIMEOUT ST (SAFETY) ×2 IMPLANT
COVER LIGHT HANDLE STERIS (MISCELLANEOUS) ×4 IMPLANT
DECANTER SPIKE VIAL GLASS SM (MISCELLANEOUS) ×2 IMPLANT
DERMABOND ADVANCED .7 DNX12 (GAUZE/BANDAGES/DRESSINGS) IMPLANT
DRAPE EENT ADH APERT 31X51 STR (DRAPES) IMPLANT
ELECT REM PT RETURN 9FT ADLT (ELECTROSURGICAL) ×2 IMPLANT
ELECTRODE REM PT RTRN 9FT ADLT (ELECTROSURGICAL) ×2 IMPLANT
GLOVE BIOGEL PI IND STRL 7.0 (GLOVE) ×4 IMPLANT
GLOVE SURG SS PI 7.5 STRL IVOR (GLOVE) ×4 IMPLANT
GOWN STRL REUS W/TWL LRG LVL3 (GOWN DISPOSABLE) ×4 IMPLANT
KIT TURNOVER KIT A (KITS) ×2 IMPLANT
MANIFOLD NEPTUNE II (INSTRUMENTS) ×2 IMPLANT
NDL HYPO 25X1 1.5 SAFETY (NEEDLE) ×2 IMPLANT
NEEDLE HYPO 25X1 1.5 SAFETY (NEEDLE) ×2 IMPLANT
NS IRRIG 1000ML POUR BTL (IV SOLUTION) ×2 IMPLANT
PACK MINOR (CUSTOM PROCEDURE TRAY) ×2 IMPLANT
PAD ARMBOARD POSITIONER FOAM (MISCELLANEOUS) ×2 IMPLANT
POSITIONER HEAD 8X9X4 ADT (SOFTGOODS) ×2 IMPLANT
SET BASIN LINEN APH (SET/KITS/TRAYS/PACK) ×2 IMPLANT
SUT MNCRL AB 4-0 PS2 18 (SUTURE) IMPLANT
SUT VIC AB 3-0 SH 27X BRD (SUTURE) IMPLANT
SYR CONTROL 10ML LL (SYRINGE) ×2 IMPLANT
TOWEL OR 17X26 4PK STRL BLUE (TOWEL DISPOSABLE) ×2 IMPLANT

## 2023-09-10 NOTE — Anesthesia Postprocedure Evaluation (Signed)
 Anesthesia Post Note  Patient: Joe Freeman  Procedure(s) Performed: EXCISION LIPOMA, BUTTOCKS (Right: Buttocks) EXCISION SEBACEOUS CYST, BACK (Back)  Patient location during evaluation: PACU Anesthesia Type: General Level of consciousness: awake and alert Pain management: pain level controlled Vital Signs Assessment: post-procedure vital signs reviewed and stable Respiratory status: spontaneous breathing, nonlabored ventilation, respiratory function stable and patient connected to nasal cannula oxygen Cardiovascular status: blood pressure returned to baseline and stable Postop Assessment: no apparent nausea or vomiting Anesthetic complications: no   There were no known notable events for this encounter.   Last Vitals:  Vitals:   09/10/23 0900 09/10/23 0915  BP:  118/81  Pulse: (!) 58 64  Resp: (!) 9 12  Temp:  (!) 36.3 C  SpO2: 98% 100%    Last Pain:  Vitals:   09/10/23 0915  TempSrc: Oral  PainSc: 0-No pain                 Jalena Vanderlinden L Deangela Randleman

## 2023-09-10 NOTE — Op Note (Signed)
 Patient:  Joe Freeman  DOB:  April 03, 1976  MRN:  [id number]   Preop Diagnosis: Lipoma of right buttock, sebaceous cyst of back  Postop Diagnosis: Same  Procedure: Excision of 2.5 cm lipoma, right buttock Excision of 2-1/2 cm sebaceous cyst, back  Surgeon: Franky Macho, MD  Anes: General Endotracheal  Indications: Patient is a 48 year old black male who presents with both symptomatic lipoma of the right buttock as well as a sebaceous cyst on the back.  The risks and benefits of both procedures including bleeding, infection, and recurrence were fully explained to the patient, who gave informed consent.  Procedure note: The patient was placed in the left lateral decubitus position after induction of general endotracheal anesthesia.  The right buttock and back were prepped and draped using the usual sterile technique with ChloraPrep.  Surgical site confirmation was performed.  An elliptical incision was made around the base of the lipoma of the right buttock.  The lipoma extended into the subcutaneous tissue.  All of the lipoma was removed and sent to pathology further examination.  A bleeding was controlled using Bovie electrocautery.  0.5% Sensorcaine was instilled into the surrounding wound.  The subcutaneous layer was reapproximated using a 4-0 Monocryl subcuticular suture.  The skin was closed using a 4-0 Monocryl subcuticular suture.  Dermabond was applied.  Next, an incision was made in the sebaceous cyst in the upper mid back.  The Sebomin as well as wall of the sebaceous cyst were all excised and sent to pathology further examination.  A bleeding was controlled using Bovie electrocautery.  The wound was irrigated with normal saline.  The subcutaneous layer was reapproximated using a 3-0 Vicryl interrupted suture.  The skin was closed using a 4-0 Monocryl subcuticular suture.  Dermabond was applied.  All tape and needle counts were correct at the end of the procedures.  The patient was  extubated in the operating room and transferred to PACU in stable condition.  Complications: None  EBL: Minimal  Specimen: Lipoma, right buttock Sebaceous cyst, back

## 2023-09-10 NOTE — Interval H&P Note (Signed)
 History and Physical Interval Note:  09/10/2023 7:01 AM  Joe Freeman  has presented today for surgery, with the diagnosis of LIPOMA, BUTTOCKS, 3 CM SEBACEOUS CYST, BACK, 2 CM.  The various methods of treatment have been discussed with the patient and family. After consideration of risks, benefits and other options for treatment, the patient has consented to  Procedure(s): EXCISION LIPOMA, BUTTOCKS (N/A) EXCISION SEBACEOUS CYST, BACK (N/A) as a surgical intervention.  The patient's history has been reviewed, patient examined, no change in status, stable for surgery.  I have reviewed the patient's chart and labs.  Questions were answered to the patient's satisfaction.     Franky Macho

## 2023-09-10 NOTE — Anesthesia Procedure Notes (Signed)
 Procedure Name: Intubation Date/Time: 09/10/2023 7:38 AM  Performed by: Julian Reil, CRNAPre-anesthesia Checklist: Patient identified, Emergency Drugs available, Suction available and Patient being monitored Patient Re-evaluated:Patient Re-evaluated prior to induction Oxygen Delivery Method: Circle system utilized Preoxygenation: Pre-oxygenation with 100% oxygen Induction Type: IV induction Ventilation: Mask ventilation without difficulty Laryngoscope Size: Mac and 4 Grade View: Grade I Tube type: Oral Tube size: 7.5 mm Number of attempts: 1 Airway Equipment and Method: Stylet Placement Confirmation: ETT inserted through vocal cords under direct vision, positive ETCO2 and breath sounds checked- equal and bilateral Secured at: 23 cm Tube secured with: Tape Dental Injury: Teeth and Oropharynx as per pre-operative assessment

## 2023-09-10 NOTE — Transfer of Care (Signed)
 Immediate Anesthesia Transfer of Care Note  Patient: Joe Freeman  Procedure(s) Performed: EXCISION LIPOMA, BUTTOCKS (Right: Buttocks) EXCISION SEBACEOUS CYST, BACK (Back)  Patient Location: PACU  Anesthesia Type:General  Level of Consciousness: awake, alert , and oriented  Airway & Oxygen Therapy: Patient Spontanous Breathing and Patient connected to face mask oxygen  Post-op Assessment: Report given to RN and Post -op Vital signs reviewed and stable  Post vital signs: Reviewed and stable  Last Vitals:  Vitals Value Taken Time  BP 122/75   Temp 97.4   Pulse 16   Resp 15 09/10/23 0837  SpO2 100%   Vitals shown include unfiled device data.  Last Pain:  Vitals:   09/10/23 0641  TempSrc: Oral  PainSc: 0-No pain      Patients Stated Pain Goal: 6 (09/10/23 0641)  Complications: No notable events documented.

## 2023-09-11 ENCOUNTER — Encounter (HOSPITAL_COMMUNITY): Payer: Self-pay | Admitting: General Surgery

## 2023-09-13 LAB — SURGICAL PATHOLOGY
